# Patient Record
Sex: Female | Born: 1935 | Race: White | Hispanic: No | Smoking: Former smoker
Health system: Southern US, Community
[De-identification: ages and names within clinical notes are randomized; demographics above are authoritative.]

## PROBLEM LIST (undated history)

## (undated) DIAGNOSIS — B3731 Acute candidiasis of vulva and vagina: Secondary | ICD-10-CM

## (undated) DIAGNOSIS — M1990 Unspecified osteoarthritis, unspecified site: Secondary | ICD-10-CM

## (undated) DIAGNOSIS — I499 Cardiac arrhythmia, unspecified: Secondary | ICD-10-CM

## (undated) DIAGNOSIS — B373 Candidiasis of vulva and vagina: Secondary | ICD-10-CM

## (undated) DIAGNOSIS — K219 Gastro-esophageal reflux disease without esophagitis: Secondary | ICD-10-CM

---

## 2001-11-25 ENCOUNTER — Other Ambulatory Visit: Admission: RE | Admit: 2001-11-25 | Discharge: 2001-11-25 | Payer: Self-pay | Admitting: Obstetrics and Gynecology

## 2001-11-26 ENCOUNTER — Encounter: Admission: RE | Admit: 2001-11-26 | Discharge: 2001-11-26 | Payer: Self-pay | Admitting: Obstetrics and Gynecology

## 2001-11-26 ENCOUNTER — Encounter: Payer: Self-pay | Admitting: Obstetrics and Gynecology

## 2002-06-23 ENCOUNTER — Encounter: Payer: Self-pay | Admitting: Obstetrics and Gynecology

## 2002-06-23 ENCOUNTER — Encounter: Admission: RE | Admit: 2002-06-23 | Discharge: 2002-06-23 | Payer: Self-pay | Admitting: Obstetrics and Gynecology

## 2003-08-12 ENCOUNTER — Encounter: Admission: RE | Admit: 2003-08-12 | Discharge: 2003-08-12 | Payer: Self-pay | Admitting: Obstetrics and Gynecology

## 2004-05-15 ENCOUNTER — Encounter: Admission: RE | Admit: 2004-05-15 | Discharge: 2004-05-15 | Payer: Self-pay | Admitting: Internal Medicine

## 2004-07-20 ENCOUNTER — Ambulatory Visit (HOSPITAL_COMMUNITY): Admission: RE | Admit: 2004-07-20 | Discharge: 2004-07-20 | Payer: Self-pay | Admitting: *Deleted

## 2011-06-13 ENCOUNTER — Telehealth: Payer: Self-pay

## 2011-06-20 NOTE — Telephone Encounter (Signed)
PATIENT MADE APPT. FOR 6/11

## 2011-06-26 ENCOUNTER — Encounter (INDEPENDENT_AMBULATORY_CARE_PROVIDER_SITE_OTHER): Payer: Medicare Other

## 2011-06-26 DIAGNOSIS — R002 Palpitations: Secondary | ICD-10-CM

## 2015-10-12 NOTE — Progress Notes (Signed)
Pt is being scheduled for preop appt; please placed surgical orders in epic. Thanks.

## 2015-10-23 ENCOUNTER — Ambulatory Visit: Payer: Self-pay | Admitting: Orthopedic Surgery

## 2015-11-11 NOTE — Patient Instructions (Signed)
Verneda SkillJanice P Bourn  11/11/2015   Your procedure is scheduled on: 11/21/2015    [facility]  [place]trance [place]  elevators to 3rd floor to  Short Stay Center at   0515 AM.  Call this number if you have problems the morning of surgery [phone]   Remember: ONLY 1 PERSON MAY GO WITH YOU TO SHORT STAY TO GET  READY MORNING OF YOUR SURGERY.  Do not eat food or drink liquids :After Midnight.     Take these medicines the morning of surgery with A SIP OF WATER: none                                 You may not have any metal on your body including hair pins and              piercings  Do not wear jewelry, make-up, lotions, powders or perfumes, deodorant             Do not wear nail polish.  Do not shave  48 hours prior to surgery.     Do not bring valuables to the hospital. [facility] IS NOT             RESPONSIBLE   FOR VALUABLES.  Contacts, dentures or bridgework may not be worn into surgery.  Leave suitcase in the car. After surgery it may be brought to your room.        Special Instructions: coughing and deep breathing exercises, leg exercises               Please read over the following fact sheets you were given: _____________________________________________________________________             [facility] - Pre[facility] Before surgery, you can play an important role.  Because skin is not sterile, your skin needs to be as free of germs as possible.  You can reduce the number of germs on your skin by washing with CHG (chlorahexidine gluconate) soap before surgery.  CHG is an antiseptic cleaner which kills germs and bonds with the skin to continue killing germs even after washing. Please DO NOT use if you have an allergy to CHG or antibacterial soaps.  If your skin becomes reddened/irritated stop using the CHG and inform your nurse when you arrive at Short Stay. Do not shave (including legs and underarms) for at least 48 hours prior to  the first CHG shower.  You may shave your face/neck. Please follow these instructions carefully:  1.  Shower with CHG Soap the night before surgery and the  morning of Surgery.  2.  If you choose to wash your hair, wash your hair first as usual with your  normal  shampoo.  3.  After you shampoo, rinse your hair and body thoroughly to remove the  shampoo.                           4.  Use CHG as you would any other liquid soap.  You can apply chg directly  to the skin and wash                       Gently with a scrungie or clean washcloth.  5.  Apply the CHG  Soap to your body ONLY FROM THE NECK DOWN.   Do not use on face/ open                           Wound or open sores. Avoid contact with eyes, ears mouth and genitals (private parts).                       Wash face,  Genitals (private parts) with your normal soap.             6.  Wash thoroughly, paying special attention to the area where your surgery  will be performed.  7.  Thoroughly rinse your body with warm water from the neck down.  8.  DO NOT shower/wash with your normal soap after using and rinsing off  the CHG Soap.                9.  Pat yourself dry with a clean towel.            10.  Wear clean pajamas.            11.  Place clean sheets on your bed the night of your first shower and do not  sleep with pets. Day of Surgery : Do not apply any lotions/deodorants the morning of surgery.  Please wear clean clothes to the hospital/surgery center.  FAILURE TO FOLLOW THESE INSTRUCTIONS MAY RESULT IN THE CANCELLATION OF YOUR SURGERY PATIENT SIGNATURE_________________________________  NURSE SIGNATURE__________________________________  ________________________________________________________________________  WHAT IS A BLOOD TRANSFUSION? Blood Transfusion Information  A transfusion is the replacement of blood or some of its parts. Blood is made up of multiple cells which provide different functions.  Red blood cells carry oxygen and  are used for blood loss replacement.  White blood cells fight against infection.  Platelets control bleeding.  Plasma helps clot blood.  Other blood products are available for specialized needs, such as hemophilia or other clotting disorders. BEFORE THE TRANSFUSION  Who gives blood for transfusions?   Healthy volunteers who are fully evaluated to make sure their blood is safe. This is blood bank blood. Transfusion therapy is the safest it has ever been in the practice of medicine. Before blood is taken from a donor, a complete history is taken to make sure that person has no history of diseases nor engages in risky social behavior (examples are intravenous drug use or sexual activity with multiple partners). The donor's travel history is screened to minimize risk of transmitting infections, such as malaria. The donated blood is tested for signs of infectious diseases, such as HIV and hepatitis. The blood is then tested to be sure it is compatible with you in order to minimize the chance of a transfusion reaction. If you or a relative donates blood, this is often done in anticipation of surgery and is not appropriate for emergency situations. It takes many days to process the donated blood. RISKS AND COMPLICATIONS Although transfusion therapy is very safe and saves many lives, the main dangers of transfusion include:   Getting an infectious disease.  Developing a transfusion reaction. This is an allergic reaction to something in the blood you were given. Every precaution is taken to prevent this. The decision to have a blood transfusion has been considered carefully by your caregiver before blood is given. Blood is not given unless the benefits outweigh the risks. AFTER THE TRANSFUSION  Right after receiving a blood transfusion, you  will usually feel much better and more energetic. This is especially true if your red blood cells have gotten low (anemic). The transfusion raises the level of the  red blood cells which carry oxygen, and this usually causes an energy increase.  The nurse administering the transfusion will monitor you carefully for complications. HOME CARE INSTRUCTIONS  No special instructions are needed after a transfusion. You may find your energy is better. Speak with your caregiver about any limitations on activity for underlying diseases you may have. SEEK MEDICAL CARE IF:   Your condition is not improving after your transfusion.  You develop redness or irritation at the intravenous (IV) site. SEEK IMMEDIATE MEDICAL CARE IF:  Any of the following symptoms occur over the next 12 hours:  Shaking chills.  You have a temperature by mouth above 102 F (38.9 C), not controlled by medicine.  Chest, back, or muscle pain.  People around you feel you are not acting correctly or are confused.  Shortness of breath or difficulty breathing.  Dizziness and fainting.  You get a rash or develop hives.  You have a decrease in urine output.  Your urine turns a dark color or changes to pink, red, or brown. Any of the following symptoms occur over the next 10 days:  You have a temperature by mouth above 102 F (38.9 C), not controlled by medicine.  Shortness of breath.  Weakness after normal activity.  The white part of the eye turns yellow (jaundice).  You have a decrease in the amount of urine or are urinating less often.  Your urine turns a dark color or changes to pink, red, or brown. Document Released: 12/30/1999 Document Revised: 03/26/2011 Document Reviewed: 08/18/2007 ExitCare Patient Information 2014 [place], [place].  _______________________________________________________________________  Incentive Spirometer  An incentive spirometer is a tool that can help keep your lungs clear and active. This tool measures how well you are filling your lungs with each breath. Taking long deep breaths may help reverse or decrease the chance of developing breathing  (pulmonary) problems (especially infection) following:  A long period of time when you are unable to move or be active. BEFORE THE PROCEDURE   If the spirometer includes an indicator to show your best effort, your nurse or respiratory therapist will set it to a desired goal.  If possible, sit up straight or lean slightly forward. Try not to slouch.  Hold the incentive spirometer in an upright position. INSTRUCTIONS FOR USE  1. Sit on the edge of your bed if possible, or sit up as far as you can in bed or on a chair. 2. Hold the incentive spirometer in an upright position. 3. Breathe out normally. 4. Place the mouthpiece in your mouth and seal your lips tightly around it. 5. Breathe in slowly and as deeply as possible, raising the piston or the ball toward the top of the column. 6. Hold your breath for 3-5 seconds or for as long as possible. Allow the piston or ball to fall to the bottom of the column. 7. Remove the mouthpiece from your mouth and breathe out normally. 8. Rest for a few seconds and repeat Steps 1 through 7 at least 10 times every 1-2 hours when you are awake. Take your time and take a few normal breaths between deep breaths. 9. The spirometer may include an indicator to show your best effort. Use the indicator as a goal to work toward during each repetition. 10. After each set of 10 deep breaths, practice  coughing to be sure your lungs are clear. If you have an incision (the cut made at the time of surgery), support your incision when coughing by placing a pillow or rolled up towels firmly against it. Once you are able to get out of bed, walk around indoors and cough well. You may stop using the incentive spirometer when instructed by your caregiver.  RISKS AND COMPLICATIONS  Take your time so you do not get dizzy or light-headed.  If you are in pain, you may need to take or ask for pain medication before doing incentive spirometry. It is harder to take a deep breath if you  are having pain. AFTER USE  Rest and breathe slowly and easily.  It can be helpful to keep track of a log of your progress. Your caregiver can provide you with a simple table to help with this. If you are using the spirometer at home, follow these instructions: [place] IF:   You are having difficultly using the spirometer.  You have trouble using the spirometer as often as instructed.  Your pain medication is not giving enough relief while using the spirometer.  You develop fever of 100.5 F (38.1 C) or higher. SEEK IMMEDIATE MEDICAL CARE IF:   You cough up bloody sputum that had not been present before.  You develop fever of 102 F (38.9 C) or greater.  You develop worsening pain at or near the incision site. MAKE SURE YOU:   Understand these instructions.  Will watch your condition.  Will get help right away if you are not doing well or get worse. Document Released: 05/14/2006 Document Revised: 03/26/2011 Document Reviewed: 07/15/2006 [facility] [place] Patient Information 2014 [place], [place].   ________________________________________________________________________

## 2015-11-14 ENCOUNTER — Encounter (HOSPITAL_COMMUNITY)
Admission: RE | Admit: 2015-11-14 | Discharge: 2015-11-14 | Disposition: A | Discharge status: Home / Self Care | Payer: Medicare Other | Source: Ambulatory Visit | Attending: Orthopedic Surgery | Admitting: Orthopedic Surgery

## 2015-11-14 ENCOUNTER — Encounter (HOSPITAL_COMMUNITY): Payer: Self-pay | Admitting: *Deleted

## 2015-11-14 DIAGNOSIS — M1712 Unilateral primary osteoarthritis, left knee: Secondary | ICD-10-CM | POA: Insufficient documentation

## 2015-11-14 DIAGNOSIS — Z0183 Encounter for blood typing: Secondary | ICD-10-CM | POA: Insufficient documentation

## 2015-11-14 DIAGNOSIS — Z01812 Encounter for preprocedural laboratory examination: Secondary | ICD-10-CM | POA: Diagnosis not present

## 2015-11-14 HISTORY — DX: Unspecified osteoarthritis, unspecified site: M19.90

## 2015-11-14 HISTORY — DX: Cardiac arrhythmia, unspecified: I49.9

## 2015-11-14 HISTORY — DX: Acute candidiasis of vulva and vagina: B37.31

## 2015-11-14 HISTORY — DX: Candidiasis of vulva and vagina: B37.3

## 2015-11-14 HISTORY — DX: Gastro-esophageal reflux disease without esophagitis: K21.9

## 2015-11-14 LAB — ABO/RH: ABO/RH(D): O POS

## 2015-11-14 LAB — COMPREHENSIVE METABOLIC PANEL
ALBUMIN: 3.8 g/dL (ref 3.5–5.0)
ALT: 13 U/L — ABNORMAL LOW (ref 14–54)
ANION GAP: 7 (ref 5–15)
AST: 18 U/L (ref 15–41)
Alkaline Phosphatase: 51 U/L (ref 38–126)
BILIRUBIN TOTAL: 0.7 mg/dL (ref 0.3–1.2)
BUN: 18 mg/dL (ref 6–20)
CHLORIDE: 106 mmol/L (ref 101–111)
CO2: 26 mmol/L (ref 22–32)
Calcium: 9 mg/dL (ref 8.9–10.3)
Creatinine, Ser: 0.65 mg/dL (ref 0.44–1.00)
GFR calc Af Amer: >60 mL/min (ref >60)
GFR calc non Af Amer: >60 mL/min (ref >60)
GLUCOSE: 95 mg/dL (ref 65–99)
POTASSIUM: 4.7 mmol/L (ref 3.5–5.1)
SODIUM: 139 mmol/L (ref 135–145)
TOTAL PROTEIN: 6.7 g/dL (ref 6.5–8.1)

## 2015-11-14 LAB — URINALYSIS, ROUTINE W REFLEX MICROSCOPIC
BILIRUBIN URINE: NEGATIVE
Glucose, UA: NEGATIVE mg/dL
HGB URINE DIPSTICK: NEGATIVE
Ketones, ur: NEGATIVE mg/dL
Leukocytes, UA: NEGATIVE
Nitrite: NEGATIVE
Protein, ur: NEGATIVE mg/dL
SPECIFIC GRAVITY, URINE: 1.02 (ref 1.005–1.030)
pH: 5.5 (ref 5.0–8.0)

## 2015-11-14 LAB — CBC
HCT: 42.6 % (ref 36.0–46.0)
Hemoglobin: 14.1 g/dL (ref 12.0–15.0)
MCH: 29.7 pg (ref 26.0–34.0)
MCHC: 33.1 g/dL (ref 30.0–36.0)
MCV: 89.9 fL (ref 78.0–100.0)
PLATELETS: 221 10*3/uL (ref 150–400)
RBC: 4.74 MIL/uL (ref 3.87–5.11)
RDW: 13.7 % (ref 11.5–15.5)
WBC: 5.2 10*3/uL (ref 4.0–10.5)

## 2015-11-14 LAB — SURGICAL PCR SCREEN
MRSA, PCR: NEGATIVE
Staphylococcus aureus: POSITIVE — AB

## 2015-11-14 LAB — APTT: APTT: 31 s (ref 24–36)

## 2015-11-14 LAB — PROTIME-INR
INR: 0.91
Prothrombin Time: 12.2 seconds (ref 11.4–15.2)

## 2015-11-15 NOTE — Progress Notes (Signed)
Patient reported she has area at gum sore from partial.  I instructed patient she should notify dentist and have them evaluate prior to surgery on 11/21/2015.  Patient voiced understanding.

## 2015-11-15 NOTE — Progress Notes (Signed)
Patient returned call this am at 0915am approximately to state she had received message regarding nasal pcr swab results.  Patient was upset over news and I informed patient that it did not mean she had staph she had just come into contact with staph.  She stated she was upset that when she comes into hospital that a note will be on her door and she feels like she should have known prior to now that her swab results were positive.  I allowed patient to ventilate and support given.

## 2015-11-16 NOTE — Progress Notes (Signed)
Cardiac clearance on chart- 11/15/2015  LOV- Dr Ganji-11/07/15- on chart  Stress Test - 11/11/15- on chart

## 2015-11-18 NOTE — Progress Notes (Addendum)
No EKG per office at office of DR Jacinto HalimGanji.  recoreds were requested on 11/14/2015, 11/15/2015 and 11/16/2015.

## 2015-11-20 ENCOUNTER — Ambulatory Visit: Payer: Self-pay | Admitting: Orthopedic Surgery

## 2015-11-20 NOTE — H&P (Signed)
Lindsay Frazier DOB: [birth year] Divorced / Language: [place] / Race: White Female Date of Admission:  11/21/2015 CC: Left Knee Pain History of Present Illness The patient is a 82 year old female who comes in for a preoperative History and Physical. The patient is scheduled for a left total knee arthroplasty to be performed by Lindsay Frazier. Aluisio, MD at [facility] on 11-21-2015. The patient is a 82 year old female who presented with knee complaints. The patient reports left knee symptoms including: pain, stiffness, soreness and difficulty getting up from a seated position which began year(s) ago without any known injury.The patient feels that the symptoms are worsening. The patient has the current diagnosis of knee osteoarthritis. Previous work-up for this problem has included knee x-rays (ordered by Lindsay Frazier). This problem has not been previously treated. Note for "Knee pain": She also notes lateral hip pain and stiffness, possibly due to altered gait. This pain worsened with sitting for an extended period of time. Lindsay Frazier has been having problems with her left knee for quite a while now. It has been going on for several years, but in the past year she has noted increased discomfort and increased dysfunction. She says that she does have pain worse in her left hip and the left knee, but that the knee pain is definitely limiting her. She says it is very difficulty arising from a chair and it really takes a while for her to get walking. She has had progressive valgus deformity of the left knee. She is not having any instability, but her gait pattern has changed significantly and she does not feel quite as steady walking now. Her left hip hurts laterally. She is not having any groin pain. She is not having lower extremity weakness or paresthesia. It does not hurt to lay on the left side, but does hurt sometimes in the left hip when she lays on her right side. She has not lost any motion in her hip.  She is not having any right hip or right knee pain. She would like to get the knee fixed at this time. They have been treated conservatively in the past for the above stated problem and despite conservative measures, they continue to have progressive pain and severe functional limitations and dysfunction. They have failed non-operative management including home exercise, medications, and injections. It is felt that they would benefit from undergoing total joint replacement. Risks and benefits of the procedure have been discussed with the patient and they elect to proceed with surgery. There are no active contraindications to surgery such as ongoing infection or rapidly progressive neurological disease.  Problem List/Past Medical  Trochanteric bursitis of left hip (M70.62)  Primary osteoarthritis of left knee (M17.12)  Neuralgia, post-herpetic (B02.29)  Cardiac Arrhythmia  Intermittent Irregular Heartrate Gastroesophageal Reflux Disease  Osteoarthritis  Measles  Mumps  Menopause  Allergies  No Known Drug Allergies HYDROcodone Bitartrate *CHEMICALS*  Insomnia  Family History Cancer  Paternal Grandmother. Cerebrovascular Accident  Father. First Degree Relatives  reported Hypertension  Mother. Osteoarthritis  Mother. Osteoporosis  Mother. Severe allergy  child  Social History  Children  3 Current drinker  05/05/2015: Currently drinks wine 5-7 times per week Current work status  retired Scientist, physiological daily; does running / walking Living situation  live alone Marital status  divorced No history of drug/alcohol rehab  Not under pain contract  Number of flights of stairs before winded  2-3 Tobacco / smoke exposure  05/05/2015: no Tobacco use  Never smoker. 05/05/2015 Post-Surgical Plans  Living Will, Healthcare POA  Medication History Lindsay Frazier (0.45MG  Tablet, Oral) Active. Lindsay Frazier (5MG  Tablet, Oral) Active. Multivitamin (Oral)  Active. Calcium (Oral) Specific strength unknown - Active. Magnesium (500MG  Tablet, Oral) Active. Fish Oil (300MG  Capsule, 200 Oral) Active. Potassium (Oral) Specific strength unknown - Active.   Past Surgical History Cataract Surgery  bilateral    Review of Systems General Not Present- Chills, Fatigue, Fever, Memory Loss, Night Sweats, Weight Gain and Weight Loss. Skin Not Present- Eczema, Hives, Itching, Lesions and Rash. HEENT Not Present- Dentures, Double Vision, Headache, Hearing Loss, Tinnitus and Visual Loss. Respiratory Not Present- Allergies, Chronic Cough, Coughing up blood, Shortness of breath at rest and Shortness of breath with exertion. Cardiovascular Not Present- Chest Pain, Difficulty Breathing Lying Down, Murmur, Palpitations, Racing/skipping heartbeats and Swelling. Gastrointestinal Not Present- Abdominal Pain, Bloody Stool, Constipation, Diarrhea, Difficulty Swallowing, Heartburn, Jaundice, Loss of appetitie, Nausea and Vomiting. Female Genitourinary Not Present- Blood in Urine, Discharge, Flank Pain, Incontinence, Painful Urination, Urgency, Urinary frequency, Urinary Retention, Urinating at Night and Weak urinary stream. Musculoskeletal Present- Joint Pain. Not Present- Back Pain, Joint Swelling, Morning Stiffness, Muscle Pain, Muscle Weakness and Spasms. Neurological Not Present- Blackout spells, Difficulty with balance, Dizziness, Paralysis, Tremor and Weakness. Psychiatric Not Present- Insomnia.  Vitals Weight: 134 lb Height: 65in Weight was reported by patient. Height was reported by patient. Body Surface Area: 1.67 m Body Mass Index: 22.3 kg/m  Pulse: 64 (Regular)  BP: 138/82 (Sitting, Right Arm, Standard)  Physical Exam  General Mental Status -Alert, cooperative and good historian. General Appearance-pleasant, Not in acute distress. Orientation-Oriented X3. Build & Nutrition-Well nourished and Well developed.  Head and  Neck Head-normocephalic, atraumatic . Neck Global Assessment - supple, no bruit auscultated on the right, no bruit auscultated on the left.  Eye Pupil - Bilateral-Regular and Round. Motion - Bilateral-EOMI.  ENMT Note: Upper and lower dentures   Chest and Lung Exam Auscultation Breath sounds - clear at anterior chest wall and clear at posterior chest wall. Adventitious sounds - No Adventitious sounds.  Cardiovascular Auscultation Rhythm - Regular rate and rhythm. Heart Sounds - S1 WNL and S2 WNL. Murmurs & Other Heart Sounds - Auscultation of the heart reveals - No Murmurs.  Abdomen Palpation/Percussion Tenderness - Abdomen is non-tender to palpation. Rigidity (guarding) - Abdomen is soft. Auscultation Auscultation of the abdomen reveals - Bowel sounds normal.  Female Genitourinary Note: Not done, not pertinent to present illness   Musculoskeletal Note: Very pleasant, well-developed female, alert and oriented, in no apparent distress. Evaluation of her right hip, normal motion, no discomfort. Right knee, no deformity, no effusion. Range 0 to 135. No tenderness or instability. Left hip, flexion 120, rotation in 30, out 40, abduction 40 without pain in range of motion of the hip. She is tender over the left greater trochanter and palpation here reproduces her pain. Her left knee shows a significant valgus deformity. She has ranged 5 to 130. There is moderate crepitus on range of motion. There is tenderness lateral greater than medial with no instability noted. Pulses, sensation and motor are intact in both lower extremities. Gait pattern is significantly antalgic with that valgus deformity and she really has a abnormal thrust to the left leg while ambulating.  RADIOGRAPHS AP pelvis, lateral left hip, no hip arthritis, acute or chronic hip abnormalities. AP both knees, lateral of the left showed she has got significant lateral compartment arthritis of the left worse than  right knee. She is a  little narrow on the right than the left. She is bone-on-bone lateral with significant patellofemoral spurring also with valgus deformity.   Assessment & Plan Primary osteoarthritis of left knee (M17.12)  Note:Surgical Plans: Left Total Knee Replacement  Disposition: Home  PCP: Home with Family  IV TXA  Anesthesia Issues: None  Signed electronically by Beckey RutterAlezandrew L Ahuva Poynor, III PA-C

## 2015-11-21 ENCOUNTER — Encounter (HOSPITAL_COMMUNITY)
Admission: RE | Disposition: A | Discharge status: Home Health | Payer: Self-pay | Source: Ambulatory Visit | Attending: Orthopedic Surgery

## 2015-11-21 ENCOUNTER — Inpatient Hospital Stay (HOSPITAL_COMMUNITY): Payer: Medicare Other | Admitting: Registered Nurse

## 2015-11-21 ENCOUNTER — Encounter (HOSPITAL_COMMUNITY): Payer: Self-pay | Admitting: *Deleted

## 2015-11-21 ENCOUNTER — Inpatient Hospital Stay (HOSPITAL_COMMUNITY)
Admission: RE | Admit: 2015-11-21 | Discharge: 2015-11-23 | DRG: 470 | Disposition: A | Discharge status: Home Health | Payer: Medicare Other | Source: Ambulatory Visit | Attending: Orthopedic Surgery | Admitting: Orthopedic Surgery

## 2015-11-21 DIAGNOSIS — Z9842 Cataract extraction status, left eye: Secondary | ICD-10-CM | POA: Diagnosis not present

## 2015-11-21 DIAGNOSIS — M25552 Pain in left hip: Secondary | ICD-10-CM | POA: Diagnosis present

## 2015-11-21 DIAGNOSIS — M1710 Unilateral primary osteoarthritis, unspecified knee: Secondary | ICD-10-CM | POA: Diagnosis present

## 2015-11-21 DIAGNOSIS — M179 Osteoarthritis of knee, unspecified: Secondary | ICD-10-CM | POA: Diagnosis present

## 2015-11-21 DIAGNOSIS — Z87891 Personal history of nicotine dependence: Secondary | ICD-10-CM

## 2015-11-21 DIAGNOSIS — M21062 Valgus deformity, not elsewhere classified, left knee: Secondary | ICD-10-CM | POA: Diagnosis present

## 2015-11-21 DIAGNOSIS — Z9841 Cataract extraction status, right eye: Secondary | ICD-10-CM

## 2015-11-21 DIAGNOSIS — M1712 Unilateral primary osteoarthritis, left knee: Principal | ICD-10-CM | POA: Diagnosis present

## 2015-11-21 HISTORY — PX: TOTAL KNEE ARTHROPLASTY: SHX125

## 2015-11-21 LAB — TYPE AND SCREEN
ABO/RH(D): O POS
ANTIBODY SCREEN: NEGATIVE

## 2015-11-21 SURGERY — ARTHROPLASTY, KNEE, TOTAL
Anesthesia: Spinal | Site: Knee | Laterality: Left

## 2015-11-21 MED ORDER — MIDAZOLAM HCL 2 MG/2ML IJ SOLN
INTRAMUSCULAR | Status: AC
  Filled 2015-11-21: qty 2

## 2015-11-21 MED ORDER — MEPERIDINE HCL 50 MG/ML IJ SOLN: 6.2500 mg | INTRAMUSCULAR | Status: DC | PRN

## 2015-11-21 MED ORDER — MORPHINE SULFATE (PF) 10 MG/ML IV SOLN: 1.0000 mg | INTRAVENOUS | Status: DC | PRN

## 2015-11-21 MED ORDER — METOCLOPRAMIDE HCL 5 MG/ML IJ SOLN: 5.0000 mg | Freq: Three times a day (TID) | INTRAMUSCULAR | Status: DC | PRN

## 2015-11-21 MED ORDER — PROMETHAZINE HCL 25 MG/ML IJ SOLN: 12.5000 mg | Freq: Once | INTRAMUSCULAR | Status: DC | PRN

## 2015-11-21 MED ORDER — DEXAMETHASONE SODIUM PHOSPHATE 10 MG/ML IJ SOLN
10.0000 mg | Freq: Once | INTRAMUSCULAR | Status: AC
  Administered 2015-11-21: 10 mg via INTRAVENOUS

## 2015-11-21 MED ORDER — LIDOCAINE 2% (20 MG/ML) 5 ML SYRINGE
INTRAMUSCULAR | Status: DC | PRN
  Administered 2015-11-21: 40 mg via INTRAVENOUS

## 2015-11-21 MED ORDER — CEFAZOLIN SODIUM-DEXTROSE 2-4 GM/100ML-% IV SOLN
INTRAVENOUS | Status: AC
Start: 2015-11-21 — End: 2015-11-21
  Filled 2015-11-21: qty 100

## 2015-11-21 MED ORDER — KETOROLAC TROMETHAMINE 30 MG/ML IJ SOLN: 30.0000 mg | Freq: Once | INTRAMUSCULAR | Status: DC

## 2015-11-21 MED ORDER — POLYETHYLENE GLYCOL 3350 17 G PO PACK: 17.0000 g | PACK | Freq: Every day | ORAL | Status: DC | PRN

## 2015-11-21 MED ORDER — FLEET ENEMA 7-19 GM/118ML RE ENEM: 1.0000 | ENEMA | Freq: Once | RECTAL | Status: DC | PRN

## 2015-11-21 MED ORDER — TRAMADOL HCL 50 MG PO TABS: 50.0000 mg | ORAL_TABLET | Freq: Four times a day (QID) | ORAL | Status: DC | PRN

## 2015-11-21 MED ORDER — CEFAZOLIN SODIUM-DEXTROSE 2-4 GM/100ML-% IV SOLN
2.0000 g | INTRAVENOUS | Status: AC
  Administered 2015-11-21: 2 g via INTRAVENOUS

## 2015-11-21 MED ORDER — BUPIVACAINE IN DEXTROSE 0.75-8.25 % IT SOLN
INTRATHECAL | Status: DC | PRN
  Administered 2015-11-21: 1.6 mL via INTRATHECAL

## 2015-11-21 MED ORDER — SODIUM CHLORIDE 0.9 % IR SOLN
Status: DC | PRN
  Administered 2015-11-21: 1000 mL

## 2015-11-21 MED ORDER — BUPIVACAINE LIPOSOME 1.3 % IJ SUSP
INTRAMUSCULAR | Status: DC | PRN
  Administered 2015-11-21: 20 mL

## 2015-11-21 MED ORDER — DEXAMETHASONE SODIUM PHOSPHATE 10 MG/ML IJ SOLN
INTRAMUSCULAR | Status: AC
  Filled 2015-11-21: qty 1

## 2015-11-21 MED ORDER — BISACODYL 10 MG RE SUPP: 10.0000 mg | Freq: Every day | RECTAL | Status: DC | PRN

## 2015-11-21 MED ORDER — PROPOFOL 500 MG/50ML IV EMUL
INTRAVENOUS | Status: DC | PRN
  Administered 2015-11-21: 75 ug/kg/min via INTRAVENOUS

## 2015-11-21 MED ORDER — EPHEDRINE SULFATE-NACL 50-0.9 MG/10ML-% IV SOSY
PREFILLED_SYRINGE | INTRAVENOUS | Status: DC | PRN
  Administered 2015-11-21: 10 mg via INTRAVENOUS
  Administered 2015-11-21: 15 mg via INTRAVENOUS

## 2015-11-21 MED ORDER — EPHEDRINE 5 MG/ML INJ
INTRAVENOUS | Status: AC
  Filled 2015-11-21: qty 10

## 2015-11-21 MED ORDER — FENTANYL CITRATE (PF) 100 MCG/2ML IJ SOLN
INTRAMUSCULAR | Status: DC | PRN
Start: 2015-11-21 — End: 2015-11-21
  Administered 2015-11-21 (×2): 50 ug via INTRAVENOUS

## 2015-11-21 MED ORDER — METHOCARBAMOL 500 MG PO TABS
500.0000 mg | ORAL_TABLET | Freq: Four times a day (QID) | ORAL | Status: DC | PRN
  Administered 2015-11-22 – 2015-11-23 (×4): 500 mg via ORAL
  Filled 2015-11-21 (×5): qty 1

## 2015-11-21 MED ORDER — POTASSIUM CHLORIDE CRYS ER 10 MEQ PO TBCR
10.0000 meq | EXTENDED_RELEASE_TABLET | Freq: Every day | ORAL | Status: DC
  Administered 2015-11-22 – 2015-11-23 (×2): 10 meq via ORAL
  Filled 2015-11-21 (×2): qty 1

## 2015-11-21 MED ORDER — SODIUM CHLORIDE 0.9 % IJ SOLN
INTRAMUSCULAR | Status: AC
  Filled 2015-11-21: qty 50

## 2015-11-21 MED ORDER — PROPOFOL 10 MG/ML IV BOLUS
INTRAVENOUS | Status: AC
  Filled 2015-11-21: qty 60

## 2015-11-21 MED ORDER — MENTHOL 3 MG MT LOZG: 1.0000 | LOZENGE | OROMUCOSAL | Status: DC | PRN

## 2015-11-21 MED ORDER — ACETAMINOPHEN 10 MG/ML IV SOLN
INTRAVENOUS | Status: AC
  Filled 2015-11-21: qty 100

## 2015-11-21 MED ORDER — TRANEXAMIC ACID 1000 MG/10ML IV SOLN
1000.0000 mg | INTRAVENOUS | Status: AC
  Administered 2015-11-21: 1000 mg via INTRAVENOUS
  Filled 2015-11-21: qty 1100

## 2015-11-21 MED ORDER — BUPIVACAINE LIPOSOME 1.3 % IJ SUSP
20.0000 mL | Freq: Once | INTRAMUSCULAR | Status: DC
  Filled 2015-11-21: qty 20

## 2015-11-21 MED ORDER — SODIUM CHLORIDE 0.9 % IJ SOLN
INTRAMUSCULAR | Status: DC | PRN
  Administered 2015-11-21: 30 mL

## 2015-11-21 MED ORDER — HYDROMORPHONE HCL 1 MG/ML IJ SOLN
0.5000 mg | INTRAMUSCULAR | Status: DC | PRN
  Administered 2015-11-21 – 2015-11-22 (×3): 0.5 mg via INTRAVENOUS
  Filled 2015-11-21 (×3): qty 1

## 2015-11-21 MED ORDER — BUPIVACAINE HCL 0.25 % IJ SOLN
INTRAMUSCULAR | Status: DC | PRN
  Administered 2015-11-21: 30 mL

## 2015-11-21 MED ORDER — MAGNESIUM 250 MG PO TABS: 250.0000 mg | ORAL_TABLET | Freq: Every day | ORAL | Status: DC

## 2015-11-21 MED ORDER — ACETAMINOPHEN 500 MG PO TABS
1000.0000 mg | ORAL_TABLET | Freq: Four times a day (QID) | ORAL | Status: AC
  Administered 2015-11-21 – 2015-11-22 (×3): 1000 mg via ORAL
  Filled 2015-11-21 (×3): qty 2

## 2015-11-21 MED ORDER — MORPHINE SULFATE (PF) 2 MG/ML IV SOLN
1.0000 mg | INTRAVENOUS | Status: DC | PRN
  Administered 2015-11-21: 1 mg via INTRAVENOUS
  Filled 2015-11-21: qty 1

## 2015-11-21 MED ORDER — PHENOL 1.4 % MT LIQD
1.0000 | OROMUCOSAL | Status: DC | PRN
Start: 2015-11-21 — End: 2015-11-23
  Filled 2015-11-21: qty 177

## 2015-11-21 MED ORDER — METHOCARBAMOL 1000 MG/10ML IJ SOLN
500.0000 mg | Freq: Four times a day (QID) | INTRAMUSCULAR | Status: DC | PRN
  Administered 2015-11-21: 500 mg via INTRAVENOUS
  Filled 2015-11-21: qty 550
  Filled 2015-11-21: qty 5

## 2015-11-21 MED ORDER — ONDANSETRON HCL 4 MG/2ML IJ SOLN
INTRAMUSCULAR | Status: AC
  Filled 2015-11-21: qty 2

## 2015-11-21 MED ORDER — 0.9 % SODIUM CHLORIDE (POUR BTL) OPTIME
TOPICAL | Status: DC | PRN
  Administered 2015-11-21: 1000 mL

## 2015-11-21 MED ORDER — PROPOFOL 10 MG/ML IV BOLUS
INTRAVENOUS | Status: DC | PRN
Start: 2015-11-21 — End: 2015-11-21
  Administered 2015-11-21 (×2): 20 mg via INTRAVENOUS

## 2015-11-21 MED ORDER — LACTATED RINGERS IV SOLN
INTRAVENOUS | Status: DC
  Administered 2015-11-21 (×3): via INTRAVENOUS

## 2015-11-21 MED ORDER — FENTANYL CITRATE (PF) 100 MCG/2ML IJ SOLN
INTRAMUSCULAR | Status: AC
  Filled 2015-11-21: qty 2

## 2015-11-21 MED ORDER — DIPHENHYDRAMINE HCL 12.5 MG/5ML PO ELIX: 12.5000 mg | ORAL_SOLUTION | ORAL | Status: DC | PRN

## 2015-11-21 MED ORDER — FENTANYL CITRATE (PF) 100 MCG/2ML IJ SOLN: 25.0000 ug | INTRAMUSCULAR | Status: DC | PRN

## 2015-11-21 MED ORDER — ONDANSETRON HCL 4 MG/2ML IJ SOLN
4.0000 mg | Freq: Four times a day (QID) | INTRAMUSCULAR | Status: DC | PRN
  Administered 2015-11-22: 4 mg via INTRAVENOUS
  Filled 2015-11-21: qty 2

## 2015-11-21 MED ORDER — STERILE WATER FOR IRRIGATION IR SOLN
Status: DC | PRN
  Administered 2015-11-21: 3000 mL

## 2015-11-21 MED ORDER — TRANEXAMIC ACID 1000 MG/10ML IV SOLN
1000.0000 mg | Freq: Once | INTRAVENOUS | Status: AC
  Administered 2015-11-21: 1000 mg via INTRAVENOUS
  Filled 2015-11-21: qty 1100

## 2015-11-21 MED ORDER — CEFAZOLIN SODIUM-DEXTROSE 2-4 GM/100ML-% IV SOLN
2.0000 g | Freq: Four times a day (QID) | INTRAVENOUS | Status: AC
  Administered 2015-11-21 (×2): 2 g via INTRAVENOUS
  Filled 2015-11-21 (×2): qty 100

## 2015-11-21 MED ORDER — ONDANSETRON HCL 4 MG PO TABS: 4.0000 mg | ORAL_TABLET | Freq: Four times a day (QID) | ORAL | Status: DC | PRN

## 2015-11-21 MED ORDER — MAGNESIUM OXIDE 400 (241.3 MG) MG PO TABS
400.0000 mg | ORAL_TABLET | Freq: Every day | ORAL | Status: DC
  Administered 2015-11-22 – 2015-11-23 (×2): 400 mg via ORAL
  Filled 2015-11-21 (×2): qty 1

## 2015-11-21 MED ORDER — POTASSIUM 99 MG PO TABS: 99.0000 mg | ORAL_TABLET | Freq: Every day | ORAL | Status: DC

## 2015-11-21 MED ORDER — METOCLOPRAMIDE HCL 5 MG PO TABS: 5.0000 mg | ORAL_TABLET | Freq: Three times a day (TID) | ORAL | Status: DC | PRN

## 2015-11-21 MED ORDER — ONDANSETRON HCL 4 MG/2ML IJ SOLN
INTRAMUSCULAR | Status: DC | PRN
  Administered 2015-11-21: 4 mg via INTRAVENOUS

## 2015-11-21 MED ORDER — SODIUM CHLORIDE 0.9 % IV SOLN
INTRAVENOUS | Status: DC
  Administered 2015-11-21: 75 mL/h via INTRAVENOUS
  Administered 2015-11-22: 01:00:00 via INTRAVENOUS

## 2015-11-21 MED ORDER — LIDOCAINE 2% (20 MG/ML) 5 ML SYRINGE
INTRAMUSCULAR | Status: AC
Start: 2015-11-21 — End: 2015-11-21
  Filled 2015-11-21: qty 5

## 2015-11-21 MED ORDER — ACETAMINOPHEN 325 MG PO TABS
650.0000 mg | ORAL_TABLET | Freq: Four times a day (QID) | ORAL | Status: DC | PRN
  Administered 2015-11-22: 650 mg via ORAL
  Filled 2015-11-21: qty 2

## 2015-11-21 MED ORDER — RIVAROXABAN 10 MG PO TABS
10.0000 mg | ORAL_TABLET | Freq: Every day | ORAL | Status: DC
  Administered 2015-11-22 – 2015-11-23 (×2): 10 mg via ORAL
  Filled 2015-11-21 (×2): qty 1

## 2015-11-21 MED ORDER — DEXAMETHASONE SODIUM PHOSPHATE 10 MG/ML IJ SOLN
10.0000 mg | Freq: Once | INTRAMUSCULAR | Status: AC
  Administered 2015-11-22: 10 mg via INTRAVENOUS
  Filled 2015-11-21: qty 1

## 2015-11-21 MED ORDER — OXYCODONE HCL 5 MG PO TABS
5.0000 mg | ORAL_TABLET | ORAL | Status: DC | PRN
  Administered 2015-11-21 (×3): 10 mg via ORAL
  Administered 2015-11-21 (×2): 5 mg via ORAL
  Administered 2015-11-21 – 2015-11-23 (×9): 10 mg via ORAL
  Filled 2015-11-21 (×5): qty 2
  Filled 2015-11-21: qty 1
  Filled 2015-11-21 (×5): qty 2
  Filled 2015-11-21: qty 1
  Filled 2015-11-21 (×2): qty 2

## 2015-11-21 MED ORDER — ACETAMINOPHEN 650 MG RE SUPP: 650.0000 mg | Freq: Four times a day (QID) | RECTAL | Status: DC | PRN

## 2015-11-21 MED ORDER — CHLORHEXIDINE GLUCONATE 4 % EX LIQD: 60.0000 mL | Freq: Once | CUTANEOUS | Status: DC

## 2015-11-21 MED ORDER — BUPIVACAINE HCL (PF) 0.25 % IJ SOLN
INTRAMUSCULAR | Status: AC
  Filled 2015-11-21: qty 30

## 2015-11-21 MED ORDER — DOCUSATE SODIUM 100 MG PO CAPS
100.0000 mg | ORAL_CAPSULE | Freq: Two times a day (BID) | ORAL | Status: DC
Start: 2015-11-21 — End: 2015-11-23
  Administered 2015-11-21 – 2015-11-23 (×5): 100 mg via ORAL
  Filled 2015-11-21 (×5): qty 1

## 2015-11-21 MED ORDER — ACETAMINOPHEN 10 MG/ML IV SOLN
1000.0000 mg | Freq: Once | INTRAVENOUS | Status: AC
  Administered 2015-11-21: 1000 mg via INTRAVENOUS

## 2015-11-21 SURGICAL SUPPLY — 49 items
BAG DECANTER FOR FLEXI CONT (MISCELLANEOUS) IMPLANT
BAG ZIPLOCK 12X15 (MISCELLANEOUS) ×3 IMPLANT
BANDAGE ACE 6X5 VEL STRL LF (GAUZE/BANDAGES/DRESSINGS) ×3 IMPLANT
BLADE SAG 18X100X1.27 (BLADE) ×3 IMPLANT
BLADE SAW SGTL 11.0X1.19X90.0M (BLADE) ×3 IMPLANT
BOWL SMART MIX CTS (DISPOSABLE) ×3 IMPLANT
CAP KNEE TOTAL 3 SIGMA ×3 IMPLANT
CEMENT HV SMART SET (Cement) ×6 IMPLANT
CLOSURE WOUND 1/2 X4 (GAUZE/BANDAGES/DRESSINGS) ×1
CLOTH BEACON ORANGE TIMEOUT ST (SAFETY) ×3 IMPLANT
CUFF TOURN SGL QUICK 34 (TOURNIQUET CUFF) ×3
CUFF TRNQT CYL 34X4X40X1 (TOURNIQUET CUFF) ×1 IMPLANT
DECANTER SPIKE VIAL GLASS SM (MISCELLANEOUS) ×3 IMPLANT
DRAPE U-SHAPE 47X51 STRL (DRAPES) ×3 IMPLANT
DRSG ADAPTIC 3X8 NADH LF (GAUZE/BANDAGES/DRESSINGS) ×3 IMPLANT
DRSG PAD ABDOMINAL 8X10 ST (GAUZE/BANDAGES/DRESSINGS) ×3 IMPLANT
DURAPREP 26ML APPLICATOR (WOUND CARE) ×3 IMPLANT
ELECT REM PT RETURN 9FT ADLT (ELECTROSURGICAL) ×3
ELECTRODE REM PT RTRN 9FT ADLT (ELECTROSURGICAL) ×1 IMPLANT
EVACUATOR 1/8 PVC DRAIN (DRAIN) ×3 IMPLANT
GAUZE SPONGE 4X4 12PLY STRL (GAUZE/BANDAGES/DRESSINGS) ×3 IMPLANT
GLOVE BIO SURGEON STRL SZ7.5 (GLOVE) IMPLANT
GLOVE BIO SURGEON STRL SZ8 (GLOVE) ×3 IMPLANT
GLOVE BIOGEL PI IND STRL 6.5 (GLOVE) IMPLANT
GLOVE BIOGEL PI IND STRL 8 (GLOVE) ×1 IMPLANT
GLOVE BIOGEL PI INDICATOR 6.5 (GLOVE)
GLOVE BIOGEL PI INDICATOR 8 (GLOVE) ×2
GLOVE SURG SS PI 6.5 STRL IVOR (GLOVE) ×18 IMPLANT
GOWN STRL REUS W/TWL LRG LVL3 (GOWN DISPOSABLE) ×6 IMPLANT
GOWN STRL REUS W/TWL XL LVL3 (GOWN DISPOSABLE) ×6 IMPLANT
HANDPIECE INTERPULSE COAX TIP (DISPOSABLE) ×2
IMMOBILIZER KNEE 20 (SOFTGOODS) ×3
IMMOBILIZER KNEE 20 THIGH 36 (SOFTGOODS) ×1 IMPLANT
MANIFOLD NEPTUNE II (INSTRUMENTS) ×3 IMPLANT
NS IRRIG 1000ML POUR BTL (IV SOLUTION) ×3 IMPLANT
PACK TOTAL KNEE CUSTOM (KITS) ×3 IMPLANT
PADDING CAST COTTON 6X4 STRL (CAST SUPPLIES) ×3 IMPLANT
POSITIONER SURGICAL ARM (MISCELLANEOUS) ×3 IMPLANT
SET HNDPC FAN SPRY TIP SCT (DISPOSABLE) ×1 IMPLANT
STRIP CLOSURE SKIN 1/2X4 (GAUZE/BANDAGES/DRESSINGS) ×2 IMPLANT
SUT MNCRL AB 4-0 PS2 18 (SUTURE) ×3 IMPLANT
SUT VIC AB 2-0 CT1 27 (SUTURE) ×6
SUT VIC AB 2-0 CT1 TAPERPNT 27 (SUTURE) ×3 IMPLANT
SUT VLOC 180 0 24IN GS25 (SUTURE) ×3 IMPLANT
SYR 50ML LL SCALE MARK (SYRINGE) IMPLANT
TRAY FOLEY W/METER SILVER 16FR (SET/KITS/TRAYS/PACK) ×3 IMPLANT
WATER STERILE IRR 1500ML POUR (IV SOLUTION) ×3 IMPLANT
WRAP KNEE MAXI GEL POST OP (GAUZE/BANDAGES/DRESSINGS) ×3 IMPLANT
YANKAUER SUCT BULB TIP 10FT TU (MISCELLANEOUS) ×3 IMPLANT

## 2015-11-21 NOTE — Anesthesia Procedure Notes (Signed)
Spinal  Patient location during procedure: OR Start time: 11/21/2015 7:06 AM End time: 11/21/2015 7:10 AM Staffing Anesthesiologist: Leilani AbleHATCHETT, Taeya Theall Performed: anesthesiologist  Preanesthetic Checklist Completed: patient identified, surgical consent, pre-op evaluation, timeout performed, IV checked, risks and benefits discussed and monitors and equipment checked Spinal Block Patient position: sitting Prep: site prepped and draped and DuraPrep Patient monitoring: heart rate, cardiac monitor, continuous pulse ox and blood pressure Approach: midline Location: L3-4 Injection technique: single-shot Needle Needle type: Pencan  Needle gauge: 24 G Needle length: 9 cm Needle insertion depth: 6 cm Assessment Sensory level: T10

## 2015-11-21 NOTE — Anesthesia Postprocedure Evaluation (Signed)
Anesthesia Post Note  Patient: Lindsay Frazier  Procedure(s) Performed: Procedure(s) (LRB): LEFT TOTAL KNEE ARTHROPLASTY (Left)  Patient location during evaluation: PACU Anesthesia Type: Spinal Level of consciousness: awake Pain management: pain level controlled Vital Signs Assessment: post-procedure vital signs reviewed and stable Respiratory status: spontaneous breathing Cardiovascular status: stable Postop Assessment: no headache, no backache, spinal receding, patient able to bend at knees and no signs of nausea or vomiting Anesthetic complications: no     Last Vitals:  Vitals:   11/21/15 0900 11/21/15 0915  BP: 121/65   Pulse: (!) 59 (!) 59  Resp: 12 14  Temp:  36.4 C    Last Pain:  Vitals:   11/21/15 0544  TempSrc: Oral   Pain Goal: Patients Stated Pain Goal: 4 (11/21/15 0559)  LLE Motor Response: Purposeful movement (11/21/15 0915) LLE Sensation: Decreased;Numbness (11/21/15 0915) RLE Motor Response: Purposeful movement (11/21/15 0915) RLE Sensation: Full sensation (11/21/15 0915) L Sensory Level: L4-Anterior knee, lower leg (11/21/15 0915) R Sensory Level: S1-Sole of foot, small toes (11/21/15 0915)  Sakara Lehtinen JR,JOHN Susann GivensFRANKLIN

## 2015-11-21 NOTE — Interval H&P Note (Signed)
History and Physical Interval Note:  11/21/2015 6:45 AM  Lindsay Frazier  has presented today for surgery, with the diagnosis of OA left knee  The various methods of treatment have been discussed with the patient and family. After consideration of risks, benefits and other options for treatment, the patient has consented to  Procedure(s): LEFT TOTAL KNEE ARTHROPLASTY (Left) as a surgical intervention .  The patient's history has been reviewed, patient examined, no change in status, stable for surgery.  I have reviewed the patient's chart and labs.  Questions were answered to the patient's satisfaction.     Loanne DrillingALUISIO,Shaleen Talamantez V

## 2015-11-21 NOTE — H&P (View-Only) (Signed)
Lindsay Frazier DOB: [birth year] Divorced / Language: [place] / Race: White Female Date of Admission:  11/21/2015 CC: Left Knee Pain History of Present Illness The patient is a 82 year old female who comes in for a preoperative History and Physical. The patient is scheduled for a left total knee arthroplasty to be performed by Dr. Gus Rankin. Aluisio, MD at [facility] on 11-21-2015. The patient is a 82 year old female who presented with knee complaints. The patient reports left knee symptoms including: pain, stiffness, soreness and difficulty getting up from a seated position which began year(s) ago without any known injury.The patient feels that the symptoms are worsening. The patient has the current diagnosis of knee osteoarthritis. Previous work-up for this problem has included knee x-rays (ordered by Dr. Amanda Pea). This problem has not been previously treated. Note for "Knee pain": She also notes lateral hip pain and stiffness, possibly due to altered gait. This pain worsened with sitting for an extended period of time. [name] has been having problems with her left knee for quite a while now. It has been going on for several years, but in the past year she has noted increased discomfort and increased dysfunction. She says that she does have pain worse in her left hip and the left knee, but that the knee pain is definitely limiting her. She says it is very difficulty arising from a chair and it really takes a while for her to get walking. She has had progressive valgus deformity of the left knee. She is not having any instability, but her gait pattern has changed significantly and she does not feel quite as steady walking now. Her left hip hurts laterally. She is not having any groin pain. She is not having lower extremity weakness or paresthesia. It does not hurt to lay on the left side, but does hurt sometimes in the left hip when she lays on her right side. She has not lost any motion in her hip.  She is not having any right hip or right knee pain. She would like to get the knee fixed at this time. They have been treated conservatively in the past for the above stated problem and despite conservative measures, they continue to have progressive pain and severe functional limitations and dysfunction. They have failed non-operative management including home exercise, medications, and injections. It is felt that they would benefit from undergoing total joint replacement. Risks and benefits of the procedure have been discussed with the patient and they elect to proceed with surgery. There are no active contraindications to surgery such as ongoing infection or rapidly progressive neurological disease.  Problem List/Past Medical  Trochanteric bursitis of left hip (M70.62)  Primary osteoarthritis of left knee (M17.12)  Neuralgia, post-herpetic (B02.29)  Cardiac Arrhythmia  Intermittent Irregular Heartrate Gastroesophageal Reflux Disease  Osteoarthritis  Measles  Mumps  Menopause  Allergies  No Known Drug Allergies HYDROcodone Bitartrate *CHEMICALS*  Insomnia  Family History Cancer  Paternal Grandmother. Cerebrovascular Accident  Father. First Degree Relatives  reported Hypertension  Mother. Osteoarthritis  Mother. Osteoporosis  Mother. Severe allergy  child  Social History  Children  3 Current drinker  05/05/2015: Currently drinks wine 5-7 times per week Current work status  retired Scientist, physiological daily; does running / walking Living situation  live alone Marital status  divorced No history of drug/alcohol rehab  Not under pain contract  Number of flights of stairs before winded  2-3 Tobacco / smoke exposure  05/05/2015: no Tobacco use  Never smoker. 05/05/2015 Post-Surgical Plans  Living Will, Healthcare POA  Medication History Premarin (0.45MG  Tablet, Oral) Active. Norethindrone Acetate (5MG  Tablet, Oral) Active. Multivitamin (Oral)  Active. Calcium (Oral) Specific strength unknown - Active. Magnesium (500MG  Tablet, Oral) Active. Fish Oil (300MG  Capsule, 200 Oral) Active. Potassium (Oral) Specific strength unknown - Active.   Past Surgical History Cataract Surgery  bilateral    Review of Systems General Not Present- Chills, Fatigue, Fever, Memory Loss, Night Sweats, Weight Gain and Weight Loss. Skin Not Present- Eczema, Hives, Itching, Lesions and Rash. HEENT Not Present- Dentures, Double Vision, Headache, Hearing Loss, Tinnitus and Visual Loss. Respiratory Not Present- Allergies, Chronic Cough, Coughing up blood, Shortness of breath at rest and Shortness of breath with exertion. Cardiovascular Not Present- Chest Pain, Difficulty Breathing Lying Down, Murmur, Palpitations, Racing/skipping heartbeats and Swelling. Gastrointestinal Not Present- Abdominal Pain, Bloody Stool, Constipation, Diarrhea, Difficulty Swallowing, Heartburn, Jaundice, Loss of appetitie, Nausea and Vomiting. Female Genitourinary Not Present- Blood in Urine, Discharge, Flank Pain, Incontinence, Painful Urination, Urgency, Urinary frequency, Urinary Retention, Urinating at Night and Weak urinary stream. Musculoskeletal Present- Joint Pain. Not Present- Back Pain, Joint Swelling, Morning Stiffness, Muscle Pain, Muscle Weakness and Spasms. Neurological Not Present- Blackout spells, Difficulty with balance, Dizziness, Paralysis, Tremor and Weakness. Psychiatric Not Present- Insomnia.  Vitals Weight: 134 lb Height: 65in Weight was reported by patient. Height was reported by patient. Body Surface Area: 1.67 m Body Mass Index: 22.3 kg/m  Pulse: 64 (Regular)  BP: 138/82 (Sitting, Right Arm, Standard)  Physical Exam  General Mental Status -Alert, cooperative and good historian. General Appearance-pleasant, Not in acute distress. Orientation-Oriented X3. Build & Nutrition-Well nourished and Well developed.  Head and  Neck Head-normocephalic, atraumatic . Neck Global Assessment - supple, no bruit auscultated on the right, no bruit auscultated on the left.  Eye Pupil - Bilateral-Regular and Round. Motion - Bilateral-EOMI.  ENMT Note: Upper and lower dentures   Chest and Lung Exam Auscultation Breath sounds - clear at anterior chest wall and clear at posterior chest wall. Adventitious sounds - No Adventitious sounds.  Cardiovascular Auscultation Rhythm - Regular rate and rhythm. Heart Sounds - S1 WNL and S2 WNL. Murmurs & Other Heart Sounds - Auscultation of the heart reveals - No Murmurs.  Abdomen Palpation/Percussion Tenderness - Abdomen is non-tender to palpation. Rigidity (guarding) - Abdomen is soft. Auscultation Auscultation of the abdomen reveals - Bowel sounds normal.  Female Genitourinary Note: Not done, not pertinent to present illness   Musculoskeletal Note: Very pleasant, well-developed female, alert and oriented, in no apparent distress. Evaluation of her right hip, normal motion, no discomfort. Right knee, no deformity, no effusion. Range 0 to 135. No tenderness or instability. Left hip, flexion 120, rotation in 30, out 40, abduction 40 without pain in range of motion of the hip. She is tender over the left greater trochanter and palpation here reproduces her pain. Her left knee shows a significant valgus deformity. She has ranged 5 to 130. There is moderate crepitus on range of motion. There is tenderness lateral greater than medial with no instability noted. Pulses, sensation and motor are intact in both lower extremities. Gait pattern is significantly antalgic with that valgus deformity and she really has a abnormal thrust to the left leg while ambulating.  RADIOGRAPHS AP pelvis, lateral left hip, no hip arthritis, acute or chronic hip abnormalities. AP both knees, lateral of the left showed she has got significant lateral compartment arthritis of the left worse than  right knee. She is a  little narrow on the right than the left. She is bone-on-bone lateral with significant patellofemoral spurring also with valgus deformity.   Assessment & Plan Primary osteoarthritis of left knee (M17.12)  Note:Surgical Plans: Left Total Knee Replacement  Disposition: Home  PCP: Home with Family  IV TXA  Anesthesia Issues: None  Signed electronically by Beckey RutterAlezandrew L Tationa Stech, III PA-C

## 2015-11-21 NOTE — Op Note (Signed)
Pre-operative diagnosis- Osteoarthritis Left knee(s)  Post-operative diagnosis- Osteoarthritis  Left knee(s)  Procedure-   Left Total Knee Arthroplasty  Surgeon- Gus RankinFrank V. Jebidiah Baggerly, MD  Assistant- Dimitri PedAmber Constable, PA-C   Anesthesia-  Spinal   EBL- * No blood loss amount entered *   Drains Hemovac x 1   Tourniquet time  Total Tourniquet Time Documented: Thigh (Left) - 34 minutes Total: Thigh (Left) - 34 minutes    Complications- None  Condition-PACU-stable condition  Brief Clinical Note  Lindsay Frazier is a 82 y.o. year old female with end stage OA of her right knee with progressively worsening pain and dysfunction. She has constant pain, with activity and at rest and significant functional deficits with difficulties even with ADLs. She has had extensive non-op management including analgesics, injections of cortisone, and home exercise program, but remains in significant pain with significant dysfunction.Radiographs show bone on bone arthritis lateral and patellofemoral with large valgus deformity. She presents now for right Total Knee Arthroplasty.    Procedure in detail---       The patient is brought into the operating room and positioned supine on the operating table. After successful administration of Spinal anesthetic, a tourniquet is placed high on the Left thigh(s) and the lower extremity is prepped and draped in the usual sterile fashion. Time out is performed by the operating team and then the Left  lower extremity is wrapped in Esmarch, knee flexed and the tourniquet inflated to 300 mmHg.       A midline incision is made with a ten blade through the subcutaneous tissue to the level of the extensor mechanism. A fresh blade is used to make a lateral parapatellar arthrotomy due to the patients' valgus deformity. Soft tissue over the proximal lateral tibia is subperiosteally elevated to the joint line with a knife to the posterolateral corner but not including the structures of  the posterolateral corner. Soft tissue over the proximal medial tibia is elevated with attention being paid to avoiding the patellar tendon on the tibial tubercle. The patella is everted medially, knee flexed 90 degrees and the ACL and PCL are removed. Findings are bone on boen lateral and patellofemoral with large global osteophytes. .       The drill is used to create a starting hole in the distal femur and the canal is thoroughly irrigated with sterile saline to remove the fatty contents. The 5 degree Left  valgus alignment guide is placed into the femoral canal and the distal femoral cutting block is pinned to remove 10  mm off the distal femur. Resection is made with an oscillating saw.      The tibia is subluxed forward and the menisci are removed. The extramedullary alignment guide is placed referencing proximally at the medial aspect of the tibial tubercle and distally along the second metatarsal axis and tibial crest. The block is pinned to remove 2mm off the more deficient lateral side. Resection is made with an oscillating saw. Size 3  is the most appropriate size for the tibia and the proximal tibia is prepared with the modular drill and keel punch for that size.      The femoral sizing guide is placed and size 4  is most appropriate. Rotation is marked off the epicondylar axis and confirmed by creating a rectangular flexion gap at 90 degrees. The size 4  cutting block is pinned in this rotation and the anterior, posterior and chamfer cuts are made with the oscillating saw. The intercondylar block  is then placed and that cut is made.      Trial size 3  tibial component, trial size 4 narrow posterior stabilized femur and a 10  mm posterior stabilized rotating platform insert trial is placed. Full extension is achieved with excellent varus/valgus and   anterior/posterior balance throughout full range of motion. The patella is everted and thickness measured to be 22  mm. Free hand resection is taken to  12 mm, a 38 template is placed, lug holes are drilled, trial patella is placed, and it tracks normally. Osteophytes are removed off the posterior femur with the trial in place. All trials are removed and the cut bone surfaces prepared with pulsatile lavage. Cement is mixed and once ready for implantation, the size 3  tibial implant, size 4 narrow posterior stabilized femoral component, and the size 38  patella are cemented in place and the patella is held with the clamp. The trial insert is placed and the knee held in full extension. The Exparel (20 ml mixed with 30 ml saline) and then 20 ml of .25% Bupivicaine is injected into the extensor mechanism, posterior capsule, medial and lateral gutters and subcutaneous tissues. All extruded cement is removed and once the cement is hard the permanent 10  mm posterior stabilized rotating platform insert is placed into the tibial tray.      The wound is copiously irrigated with saline solution and the tourniquet is released for a total   tourniquet time of 34  minutes. Bleeding is identified and controlled with electrocautery. The extensor mechanism is closed with interrupted #1 V-loc leaving open a small area from the superior to inferior pole of the patella to serve as a mini lateral release. Flexion against gravity is 140  degrees and the patella tracks normally. Subcutaneous tissue is closed with 2.0 vicryl and subcuticular with running 4.0 Monocryl.The incision is cleaned and dried and steri-strips and a bulky sterile dressing are applied. The limb is placed into a knee immobilizer and the patient is awakened and transported to recovery in stable condition.      Please note that a surgical assistant was a medical necessity for this procedure in order to perform it in a safe and expeditious manner. Surgical assistant was necessary to retract the ligaments and vital neurovascular structures to prevent injury to them and also necessary for proper positioning of the limb  to allow for anatomic placement of the prosthesis.    Gus RankinFrank V. Romelle Muldoon, MD    11/21/2015, 8:16 AM

## 2015-11-21 NOTE — Evaluation (Signed)
Physical Therapy Evaluation Patient Details Name: Lindsay Frazier MRN: [id number] DOB: Aug 12, 1933 Today's Date: 11/21/2015   History of Present Illness  s/p L TKA  Clinical Impression  Pt is s/p TKA resulting in the deficits listed below (see PT Problem List). * Pt will benefit from skilled PT to increase their independence and safety with mobility to allow discharge to the venue listed below. Pt amb 16' with RW and min assist this session--distnace limited by PT d/t incr pain with activity, should progress well; planning for HHPT; will need RW and 3in1; will continue to follow     Follow Up Recommendations Home health PT    Equipment Recommendations  Rolling walker with 5" wheels;3in1 (PT)    Recommendations for Other Services       Precautions / Restrictions Precautions Precautions: Knee Required Braces or Orthoses: Knee Immobilizer - Right Knee Immobilizer - Right: Discontinue once straight leg raise with < 10 degree lag Restrictions Other Position/Activity Restrictions: WBAT      Mobility  Bed Mobility Overal bed mobility: Needs Assistance Bed Mobility: Supine to Sit     Supine to sit: Min assist     General bed mobility comments: cues for technique, assist with LLE  Transfers Overall transfer level: Needs assistance Equipment used: Rolling walker (2 wheeled) Transfers: Sit to/from Stand Sit to Stand: Min assist         General transfer comment: cues for LLE management and hand placement  Ambulation/Gait Ambulation/Gait assistance: Min assist Ambulation Distance (Feet): 16 Feet Assistive device: Rolling walker (2 wheeled) Gait Pattern/deviations: Step-to pattern;Decreased step length - left;Decreased step length - right;Trunk flexed;Decreased weight shift to left     General Gait Details: cues for sequence, RW position; assist throughout for balance  Stairs            Wheelchair Mobility    Modified Rankin (Stroke Patients Only)        Balance                                             Pertinent Vitals/Pain Pain Assessment: 0-10 Pain Score: 4  Pain Location: left knee Pain Descriptors / Indicators: Aching;Sore Pain Intervention(s): Limited activity within patient's tolerance;Monitored during session;Premedicated before session;Repositioned    Home Living Family/patient expects to be discharged to:: Private residence Living Arrangements: Spouse/significant other Available Help at Discharge: Family;Available 24 hours/day Type of Home: House Home Access: Stairs to enter   Enter[employer] CorporationEntrance Stairs-Number of Steps: 1 Home Layout: Two level Home Equipment: None Additional Comments: plans to go upstairs and stay on second level    Prior Function Level of Independence: Independent               Hand Dominance        Extremity/Trunk Assessment   Upper Extremity Assessment: Defer to OT evaluation;Overall WFL for tasks assessed           Lower Extremity Assessment: LLE deficits/detail   LLE Deficits / Details: able to assist with SLR, ankle WFL; expected post op weakness     Communication   Communication: No difficulties  Cognition Arousal/Alertness: Awake/alert Behavior During Therapy: WFL for tasks assessed/performed Overall Cognitive Status: Within Functional Limits for tasks assessed                      General Comments      Exercises  Total Joint Exercises Ankle Circles/Pumps: AROM;Both;10 reps Quad Sets: Both;AROM;5 reps   Assessment/Plan    PT Assessment Patient needs continued PT services  PT Problem List Decreased strength;Decreased range of motion;Decreased activity tolerance;Decreased balance;Decreased mobility;Decreased knowledge of use of DME;Decreased knowledge of precautions;Pain          PT Treatment Interventions DME instruction;Gait training;Functional mobility training;Therapeutic exercise;Therapeutic activities;Patient/family education;Stair  training    PT Goals (Current goals can be found in the Care Plan section)  Acute Rehab PT Goals Patient Stated Goal: less pain with activity PT Goal Formulation: With patient Time For Goal Achievement: 11/25/15 Potential to Achieve Goals: Good    Frequency 7X/week   Barriers to discharge        Co-evaluation               End of Session Equipment Utilized During Treatment: Gait belt;Left knee immobilizer Activity Tolerance: Patient tolerated treatment well Patient left: in chair;with call bell/phone within reach;with chair alarm set Nurse Communication: Mobility status         Time: 1449-1520 PT Time Calculation (min) (ACUTE ONLY): 31 min   Charges:   PT Evaluation $PT Eval Low Complexity: 1 Procedure PT Treatments $Gait Training: 8-22 mins   PT G Codes:        Alvine Mostafa 11/21/2015, 6:05 PM

## 2015-11-21 NOTE — Anesthesia Preprocedure Evaluation (Signed)
Anesthesia Evaluation  Patient identified by MRN, date of birth, ID band Patient awake    Reviewed: Allergy & Precautions, NPO status , Patient's Chart, lab work & pertinent test results  Airway Mallampati: I       Dental no notable dental hx.    Pulmonary former smoker,    Pulmonary exam normal        Cardiovascular negative cardio ROS Normal cardiovascular exam     Neuro/Psych negative neurological ROS  negative psych ROS   GI/Hepatic Neg liver ROS,   Endo/Other  negative endocrine ROS  Renal/GU negative Renal ROS  negative genitourinary   Musculoskeletal   Abdominal Normal abdominal exam  (+)   Peds negative pediatric ROS (+)  Hematology negative hematology ROS (+)   Anesthesia Other Findings   Reproductive/Obstetrics negative OB ROS                             Anesthesia Physical Anesthesia Plan  ASA: II  Anesthesia Plan: Spinal   Post-op Pain Management:    Induction:   Airway Management Planned:   Additional Equipment:   Intra-op Plan:   Post-operative Plan:   Informed Consent: I have reviewed the patients History and Physical, chart, labs and discussed the procedure including the risks, benefits and alternatives for the proposed anesthesia with the patient or authorized representative who has indicated his/her understanding and acceptance.     Plan Discussed with: CRNA and Surgeon  Anesthesia Plan Comments:         Anesthesia Quick Evaluation

## 2015-11-21 NOTE — Transfer of Care (Signed)
Immediate Anesthesia Transfer of Care Note  Patient: Lindsay Frazier  Procedure(s) Performed: Procedure(s): LEFT TOTAL KNEE ARTHROPLASTY (Left)  Patient Location: PACU  Anesthesia Type:Spinal  Level of Consciousness:  sedated, patient cooperative and responds to stimulation  Airway & Oxygen Therapy:Patient Spontanous Breathing and Patient connected to face mask oxgen  Post-op Assessment:  Report given to PACU RN and Post -op Vital signs reviewed and stable  Post vital signs:  Reviewed and stable  Last Vitals:  Vitals:   11/21/15 0544  BP: (!) 151/70  Pulse: 73  Resp: 18  Temp: 36.6 C    Complications: No apparent anesthesia complications

## 2015-11-22 LAB — CBC
HEMATOCRIT: 32.1 % — AB (ref 36.0–46.0)
HEMOGLOBIN: 10.6 g/dL — AB (ref 12.0–15.0)
MCH: 30.1 pg (ref 26.0–34.0)
MCHC: 33 g/dL (ref 30.0–36.0)
MCV: 91.2 fL (ref 78.0–100.0)
Platelets: 167 10*3/uL (ref 150–400)
RBC: 3.52 MIL/uL — ABNORMAL LOW (ref 3.87–5.11)
RDW: 14.1 % (ref 11.5–15.5)
WBC: 8.1 10*3/uL (ref 4.0–10.5)

## 2015-11-22 LAB — BASIC METABOLIC PANEL
Anion gap: 5 (ref 5–15)
BUN: 7 mg/dL (ref 6–20)
CALCIUM: 8.2 mg/dL — AB (ref 8.9–10.3)
CHLORIDE: 106 mmol/L (ref 101–111)
CO2: 27 mmol/L (ref 22–32)
CREATININE: 0.55 mg/dL (ref 0.44–1.00)
GFR calc Af Amer: >60 mL/min (ref >60)
GFR calc non Af Amer: >60 mL/min (ref >60)
GLUCOSE: 101 mg/dL — AB (ref 65–99)
Potassium: 3.5 mmol/L (ref 3.5–5.1)
Sodium: 138 mmol/L (ref 135–145)

## 2015-11-22 MED ORDER — SODIUM CHLORIDE 0.9 % IV BOLUS (SEPSIS)
250.0000 mL | Freq: Once | INTRAVENOUS | Status: AC
  Administered 2015-11-22: 250 mL via INTRAVENOUS

## 2015-11-22 NOTE — Progress Notes (Signed)
   Subjective: 1 Day Post-Op Procedure(s) (LRB): LEFT TOTAL KNEE ARTHROPLASTY (Left) Patient reports pain as mild and moderate pain last night.  Better this morning. Denies nausea/SOB/CP at this time. Patient seen in rounds for Dr. Lequita HaltAluisio.  Daughter-in-law in room at bedside.   Patient is well, but has had some minor complaints of pain in the kne, requiring pain medications We will resume therapy today. She got up and walked 16 feet yesterday, day of surgery, with therapy. She looks good this morning and appears to be in good spirits. Plan is to go Home after hospital stay.  Objective: Vital signs in last 24 hours: Temp:  [97.4 F (36.3 C)-98.6 F (37 C)] 97.5 F (36.4 C) (11/07 0557) Pulse Rate:  [60-84] 60 (11/07 0557) Resp:  [15-16] 16 (11/07 0557) BP: (98-134)/(50-78) 101/50 (11/07 0557) SpO2:  [96 %-100 %] 100 % (11/07 0557)  Intake/Output from previous day:  Intake/Output Summary (Last 24 hours) at 11/22/15 1013 Last data filed at 11/22/15 0841  Gross per 24 hour  Intake          2938.75 ml  Output             3020 ml  Net           -81.25 ml    Intake/Output this shift: Total I/O In: 240 [P.O.:240] Out: -   Labs:  Recent Labs  11/22/15 0408  HGB 10.6*    Recent Labs  11/22/15 0408  WBC 8.1  RBC 3.52*  HCT 32.1*  PLT 167    Recent Labs  11/22/15 0408  NA 138  K 3.5  CL 106  CO2 27  BUN 7  CREATININE 0.55  GLUCOSE 101*  CALCIUM 8.2*   No results for input(s): LABPT, INR in the last 72 hours.  EXAM General - Patient is Alert, Appropriate and Oriented Extremity - Neurovascular intact Sensation intact distally Intact pulses distally Dorsiflexion/Plantar flexion intact Dressing - dressing C/D/I Motor Function - intact, moving foot and toes well on exam.  Hemovac pulled without difficulty.  Past Medical History:  Diagnosis Date  . Arthritis   . GERD (gastroesophageal reflux disease)   . Irregular heart beat   . Yeast infection of the  vagina    current on 11/14/2015 per patient     Assessment/Plan: 1 Day Post-Op Procedure(s) (LRB): LEFT TOTAL KNEE ARTHROPLASTY (Left) Principal Problem:   OA (osteoarthritis) of knee  Estimated body mass index is 21.63 kg/m as calculated from the following:   Height as of this encounter: 5\' 5"  (1.651 m).   Weight as of this encounter: 59 kg (130 lb). Advance diet Up with therapy Discharge home with home health when ready  DVT Prophylaxis - Xarelto Weight-Bearing as tolerated to left leg D/C O2 and Pulse OX and try on Room Air  HGB is 10.6 today.  Avel Peacerew Monserratt Knezevic, PA-C Orthopaedic Surgery 11/22/2015, 10:13 AM

## 2015-11-22 NOTE — Progress Notes (Signed)
   11/22/15 1400  PT Visit Information  Last PT Received On 11/22/15  Assistance Needed +1  History of Present Illness s/p L TKA  Subjective Data  Subjective I feel better  Patient Stated Goal less pain with activity  Precautions  Precautions Knee  Required Braces or Orthoses Knee Immobilizer - Right  Knee Immobilizer - Right Discontinue once straight leg raise with < 10 degree lag  Restrictions  Weight Bearing Restrictions No  Other Position/Activity Restrictions WBAT  Pain Assessment  Pain Score 5  Pain Location L knee  Pain Descriptors / Indicators Aching;Discomfort  Pain Intervention(s) Limited activity within patient's tolerance;Monitored during session;Patient requesting pain meds-RN notified;Ice applied  Cognition  Arousal/Alertness Awake/alert  Behavior During Therapy WFL for tasks assessed/performed  Overall Cognitive Status Within Functional Limits for tasks assessed  Bed Mobility  Overal bed mobility Needs Assistance  Bed Mobility Sit to Supine  Sit to supine Min assist  General bed mobility comments cues for technique, assist with LLE  Transfers  Overall transfer level Needs assistance  Equipment used Rolling walker (2 wheeled)  Transfers Sit to/from Stand  Sit to Stand Min guard;Min assist  General transfer comment cues for LLE management and hand placement  Ambulation/Gait  Ambulation/Gait assistance Min assist;Min guard  Ambulation Distance (Feet) 60 Feet (10' x2 )  Assistive device Rolling walker (2 wheeled)  Gait Pattern/deviations Step-to pattern;Narrow base of support;Trunk flexed  General Gait Details cues for sequence, RW position, upward gaze; assist throughout for balance  Balance  Overall balance assessment History of Falls (pt unsure how long ago but does report "a few" falls )  Standing balance-Leahy Scale Poor  PT - End of Session  Equipment Utilized During Treatment Gait belt;Left knee immobilizer  Activity Tolerance Patient tolerated  treatment well  Patient left in bed;with call bell/phone within reach;with bed alarm set  PT - Assessment/Plan  PT Plan Current plan remains appropriate  PT Frequency (ACUTE ONLY) 7X/week  Follow Up Recommendations Home health PT;Supervision for mobility/OOB  PT equipment Rolling walker with 5" wheels;3in1 (PT)  PT Goal Progression  Progress towards PT goals Progressing toward goals  Acute Rehab PT Goals  PT Goal Formulation With patient  Time For Goal Achievement 11/25/15  Potential to Achieve Goals Good  PT Time Calculation  PT Start Time (ACUTE ONLY) 1409  PT Stop Time (ACUTE ONLY) 1439  PT Time Calculation (min) (ACUTE ONLY) 30 min  PT General Charges  $$ ACUTE PT VISIT 1 Procedure  PT Treatments  $Gait Training 23-37 mins

## 2015-11-22 NOTE — Care Management Note (Addendum)
Case Management Note  Patient Details  Name: Lindsay Frazier MRN: [id number] Date of Birth: 05/26/1933  Subjective/Objective:                  L TKA Action/Plan: Discharge planning Expected Discharge Date:  11/23/15               Expected Discharge Plan:  [place]  In-House Referral:     Discharge planning Services  CM Consult  Post Acute Care Choice:  Home Health Choice offered to:  Patient, Adult Children  DME Arranged:  3-N-1, Walker rolling DME Agency:  [place]:  PT [place] Agency:  [facility] (formerly [facility])  Status of Service:  Completed, signed off  If discussed at [employer], dates discussed:    Additional Comments: Cm met with pt in room to offer choice of home health agency. Pt chooses [facility] to render HHPT.  Referral given to [facility] rep, [name] with address of [name], [place] [address] [phone] for scheduling HHPT.  CM notified [facility] DME rep, to please deliver the rolling walker and 3n1 to room prior to discharge.  No other CM needs were communicated. Dellie Catholic, RN 11/22/2015, 1:23 PM

## 2015-11-22 NOTE — Evaluation (Signed)
Occupational Therapy Evaluation Patient Details Name: Lindsay Frazier MRN: [id number] DOB: 30-Dec-1933 Today's Date: 11/22/2015    History of Present Illness s/p L TKA   Clinical Impression   This 82 year old female was admitted for the above sx.  She will benefit from one more session of OT to reinforce safety with toilet transfers, educate on shower transfers and further educate on AE, if she is interested in this for adls.  Goals are for supervision to min guard level    Follow Up Recommendations  Supervision/Assistance - 24 hour    Equipment Recommendations  3 in 1 bedside comode    Recommendations for Other Services       Precautions / Restrictions Precautions Precautions: Knee Required Braces or Orthoses: Knee Immobilizer - Right Knee Immobilizer - Right: Discontinue once straight leg raise with < 10 degree lag Restrictions Other Position/Activity Restrictions: WBAT      Mobility Bed Mobility               General bed mobility comments: oob  Transfers   Equipment used: Rolling walker (2 wheeled) Transfers: Sit to/from Stand Sit to Stand: Min guard         General transfer comment: cues for UE/LE placement    Balance                                            ADL Overall ADL's : Needs assistance/impaired     Grooming: Oral care;Wash/dry hands;Supervision/safety;Standing   Upper Body Bathing: Set up;Sitting   Lower Body Bathing: Minimal assistance;Sit to/from stand   Upper Body Dressing : Set up;Sitting   Lower Body Dressing: Moderate assistance;Sit to/from stand   Toilet Transfer: Minimal assistance;Ambulation;BSC;RW   Toileting- Clothing Manipulation and Hygiene: Minimal assistance;Sit to/from stand         General ADL Comments: performed ADL both at sink in room and finished in bathroom. Pt had a LOB when ambulating to commode.  Space was tight and she started to lift walker.  When seated, demonstrated side  stepping through tight space.  Did not use AE this session. Will further educate on next session.  Pt is unable to lift LLE on her own at this time     Vision     Perception     Praxis      Pertinent Vitals/Pain Pain Score: 4  Pain Location: L knee Pain Descriptors / Indicators: Aching Pain Intervention(s): Limited activity within patient's tolerance;Monitored during session;Premedicated before session;Repositioned;Ice applied     Hand Dominance     Extremity/Trunk Assessment Upper Extremity Assessment Upper Extremity Assessment: Overall WFL for tasks assessed           Communication Communication Communication: No difficulties   Cognition Arousal/Alertness: Awake/alert Behavior During Therapy: WFL for tasks assessed/performed Overall Cognitive Status: Within Functional Limits for tasks assessed                     General Comments       Exercises       Shoulder Instructions      Home Living Family/patient expects to be discharged to:: Private residence Living Arrangements: Spouse/significant other Available Help at Discharge: Family;Available 24 hours/day Type of Home: House             Bathroom Shower/Tub: Producer, television/film/videoWalk-in shower   Bathroom Toilet: Standard  Additional Comments: plans to go upstairs and stay on second level      Prior Functioning/Environment Level of Independence: Independent                 OT Problem List: Decreased strength;Decreased activity tolerance;Impaired balance (sitting and/or standing);Decreased knowledge of use of DME or AE;Pain   OT Treatment/Interventions: Self-care/ADL training;DME and/or AE instruction;Patient/family education;Balance training    OT Goals(Current goals can be found in the care plan section) Acute Rehab OT Goals Patient Stated Goal: less pain with activity OT Goal Formulation: With patient Time For Goal Achievement: 11/29/15 Potential to Achieve Goals: Good ADL Goals Pt Will  Transfer to Toilet: ambulating;bedside commode;with supervision Pt Will Perform Tub/Shower Transfer: Shower transfer;with min guard assist;ambulating;shower seat Additional ADL Goal #1: pt will demonstrate use of AE for adls vs verbalize understanding of use  OT Frequency: Min 2X/week   Barriers to D/C:            Co-evaluation              End of Session    Activity Tolerance: Patient tolerated treatment well Patient left: in chair;with call bell/phone within reach   Time: 1126-1205 OT Time Calculation (min): 39 min Charges:  OT General Charges $OT Visit: 1 Procedure OT Evaluation $OT Eval Low Complexity: 1 Procedure OT Treatments $Self Care/Home Management : 23-37 mins G-Codes:    Macguire Holsinger 11/22/2015, 12:51 PM Marica OtterMaryellen Dace Denn, OTR/L [phone] 11/22/2015

## 2015-11-22 NOTE — Progress Notes (Signed)
Physical Therapy Treatment Patient Details Name: Lindsay Frazier MRN: [id number] DOB: [birth year] Today's Date: 11/22/2015    History of Present Illness s/p L TKA    PT Comments    Progressing well with mobility; somewhat limited this am by nausea; will continue to follow;  Follow Up Recommendations  Home health PT;Supervision for mobility/OOB     Equipment Recommendations  Rolling walker with 5" wheels;3in1 (PT)    Recommendations for Other Services       Precautions / Restrictions Precautions Precautions: Knee Required Braces or Orthoses: Knee Immobilizer - Right Knee Immobilizer - Right: Discontinue once straight leg raise with < 10 degree lag Restrictions Weight Bearing Restrictions: No Other Position/Activity Restrictions: WBAT    Mobility  Bed Mobility   Bed Mobility: Supine to Sit     Supine to sit: Min assist     General bed mobility comments: cues for technique, assist with LLE  Transfers Overall transfer level: Needs assistance Equipment used: Rolling walker (2 wheeled) Transfers: Sit to/from Stand Sit to Stand: Min guard         General transfer comment: cues for LLE management and hand placement  Ambulation/Gait Ambulation/Gait assistance: Min assist Ambulation Distance (Feet): 15 Feet (12' more) Assistive device: Rolling walker (2 wheeled) Gait Pattern/deviations: Step-to pattern;Narrow base of support;Antalgic;Decreased weight shift to left;Decreased step length - right;Decreased step length - left     General Gait Details: cues for sequence, RW position; assist throughout for balance   Stairs            Wheelchair Mobility    Modified Rankin (Stroke Patients Only)       Balance Overall balance assessment: Needs assistance           Standing balance-Leahy Scale: Poor Standing balance comment: reliant on UEs for balance                    Cognition Arousal/Alertness: Awake/alert Behavior During Therapy: WFL  for tasks assessed/performed Overall Cognitive Status: Within Functional Limits for tasks assessed                      Exercises Total Joint Exercises Ankle Circles/Pumps: AROM;Both;10 reps Quad Sets: Both;AROM;10 reps Short Arc QuadBarbaraann Boys: AAROM;Left;10 reps Heel Slides: Left;10 reps;AAROM Hip ABduction/ADduction: AROM;Left;10 reps Straight Leg Raises: AAROM;10 reps;Left    General Comments        Pertinent Vitals/Pain Pain Score: 5  Pain Location: L knee Pain Descriptors / Indicators: Sore Pain Intervention(s): Limited activity within patient's tolerance;Monitored during session;Premedicated before session;Repositioned    Home Living Family/patient expects to be discharged to:: Private residence Living Arrangements: Spouse/significant other Available Help at Discharge: Family;Available 24 hours/day Type of Home: House         Additional Comments: plans to go upstairs and stay on second level    Prior Function Level of Independence: Independent          PT Goals (current goals can now be found in the care plan section) Acute Rehab PT Goals Patient Stated Goal: less pain with activity PT Goal Formulation: With patient Time For Goal Achievement: 11/25/15 Potential to Achieve Goals: Good Progress towards PT goals: Progressing toward goals    Frequency    7X/week      PT Plan Current plan remains appropriate    Co-evaluation             End of Session Equipment Utilized During Treatment: Gait belt;Left knee immobilizer Activity Tolerance: Patient tolerated treatment  well Patient left: in chair;with call bell/phone within reach;with chair alarm set     Time: 1026-1057 PT Time Calculation (min) (ACUTE ONLY): 31 min  Charges:  $Gait Training: 8-22 mins $Therapeutic Exercise: 8-22 mins                    G Codes:      Lindsay Frazier 11/22/2015, 1:16 PM

## 2015-11-22 NOTE — Progress Notes (Signed)
Pt is not having pain relief after IV morphine given. Notified on-call PA Algernon HuxleyMatthew Babbish received orders for IV dilaudid 0.5-1mg  and to discontinue IV morphine. Will continue to monitor.

## 2015-11-22 NOTE — Discharge Instructions (Addendum)
° °Dr. Frank Aluisio °Total Joint Specialist °[facility] °[address] °[place], [place] [address] °[phone] ° °TOTAL KNEE REPLACEMENT POSTOPERATIVE DIRECTIONS ° °Knee Rehabilitation, Guidelines Following Surgery  °Results after knee surgery are often greatly improved when you follow the exercise, range of motion and muscle strengthening exercises prescribed by your doctor. Safety measures are also important to protect the knee from further injury. Any time any of these exercises cause you to have increased pain or swelling in your knee joint, decrease the amount until you are comfortable again and slowly increase them. If you have problems or questions, call your caregiver or physical therapist for advice.  ° °HOME CARE INSTRUCTIONS  °Remove items at home which could result in a fall. This includes throw rugs or furniture in walking pathways.  °· ICE to the affected knee every three hours for 30 minutes at a time and then as needed for pain and swelling.  Continue to use ice on the knee for pain and swelling from surgery. You may notice swelling that will progress down to the foot and ankle.  This is normal after surgery.  Elevate the leg when you are not up walking on it.   °· Continue to use the breathing machine which will help keep your temperature down.  It is common for your temperature to cycle up and down following surgery, especially at night when you are not up moving around and exerting yourself.  The breathing machine keeps your lungs expanded and your temperature down. °· Do not place pillow under knee, focus on keeping the knee straight while resting ° °DIET °You may resume your previous home diet once your are discharged from the hospital. ° °DRESSING / WOUND CARE / SHOWERING °You may shower 3 days after surgery, but keep the wounds dry during showering.  You may use an occlusive plastic wrap (Press'n Seal for example), NO SOAKING/SUBMERGING IN THE BATHTUB.  If the  bandage gets wet, change with a clean dry gauze.  If the incision gets wet, pat the wound dry with a clean towel. °You may start showering once you are discharged home but do not submerge the incision under water. Just pat the incision dry and apply a dry gauze dressing on daily. °Change the surgical dressing daily and reapply a dry dressing each time. ° °ACTIVITY °Walk with your walker as instructed. °Use walker as long as suggested by your caregivers. °Avoid periods of inactivity such as sitting longer than an hour when not asleep. This helps prevent blood clots.  °You may resume a sexual relationship in one month or when given the OK by your doctor.  °You may return to work once you are cleared by your doctor.  °Do not drive a car for 6 weeks or until released by you surgeon.  °Do not drive while taking narcotics. ° °WEIGHT BEARING °Weight bearing as tolerated with assist device (walker, cane, etc) as directed, use it as long as suggested by your surgeon or therapist, typically at least 4-6 weeks. ° °POSTOPERATIVE CONSTIPATION PROTOCOL °Constipation - defined medically as fewer than three stools per week and severe constipation as less than one stool per week. ° °One of the most common issues patients have following surgery is constipation.  Even if you have a regular bowel pattern at home, your normal regimen is likely to be disrupted due to multiple reasons following surgery.  Combination of anesthesia, postoperative narcotics, change in appetite and fluid intake all can affect your bowels.    In order to avoid complications following surgery, here are some recommendations in order to help you during your recovery period. ° °Colace (docusate) - Pick up an over-the-counter form of Colace or another stool softener and take twice a day as long as you are requiring postoperative pain medications.  Take with a full glass of water daily.  If you experience loose stools or diarrhea, hold the colace until you stool forms  back up.  If your symptoms do not get better within 1 week or if they get worse, check with your doctor. ° °Dulcolax (bisacodyl) - Pick up over-the-counter and take as directed by the product packaging as needed to assist with the movement of your bowels.  Take with a full glass of water.  Use this product as needed if not relieved by Colace only.  ° °MiraLax (polyethylene glycol) - Pick up over-the-counter to have on hand.  MiraLax is a solution that will increase the amount of water in your bowels to assist with bowel movements.  Take as directed and can mix with a glass of water, juice, soda, coffee, or tea.  Take if you go more than two days without a movement. °Do not use MiraLax more than once per day. Call your doctor if you are still constipated or irregular after using this medication for 7 days in a row. ° °If you continue to have problems with postoperative constipation, please contact the office for further assistance and recommendations.  If you experience "the worst abdominal pain ever" or develop nausea or vomiting, please contact the office immediatly for further recommendations for treatment. ° °ITCHING ° If you experience itching with your medications, try taking only a single pain pill, or even half a pain pill at a time.  You can also use Benadryl over the counter for itching or also to help with sleep.  ° °TED HOSE STOCKINGS °Wear the elastic stockings on both legs for three weeks following surgery during the day but you may remove then at night for sleeping. ° °MEDICATIONS °See your medication summary on the “After Visit Summary” that the nursing staff will review with you prior to discharge.  You may have some home medications which will be placed on hold until you complete the course of blood thinner medication.  It is important for you to complete the blood thinner medication as prescribed by your surgeon.  Continue your approved medications as instructed at time of  discharge. ° °PRECAUTIONS °If you experience chest pain or shortness of breath - call 911 immediately for transfer to the hospital emergency department.  °If you develop a fever greater that 101 F, purulent drainage from wound, increased redness or drainage from wound, foul odor from the wound/dressing, or calf pain - CONTACT YOUR SURGEON.   °                                                °FOLLOW-UP APPOINTMENTS °Make sure you keep all of your appointments after your operation with your surgeon and caregivers. You should call the office at the above phone number and make an appointment for approximately two weeks after the date of your surgery or on the date instructed by your surgeon outlined in the "After Visit Summary". ° ° °RANGE OF MOTION AND STRENGTHENING EXERCISES  °Rehabilitation of the knee is important following a knee injury or   an operation. After just a few days of immobilization, the muscles of the thigh which control the knee become weakened and shrink (atrophy). Knee exercises are designed to build up the tone and strength of the thigh muscles and to improve knee motion. Often times heat used for twenty to thirty minutes before working out will loosen up your tissues and help with improving the range of motion but do not use heat for the first two weeks following surgery. These exercises can be done on a training (exercise) mat, on the floor, on a table or on a bed. Use what ever works the best and is most comfortable for you Knee exercises include:  °Leg Lifts - While your knee is still immobilized in a splint or cast, you can do straight leg raises. Lift the leg to 60 degrees, hold for 3 sec, and slowly lower the leg. Repeat 10-20 times 2-3 times daily. Perform this exercise against resistance later as your knee gets better.  °Quad and Hamstring Sets - Tighten up the muscle on the front of the thigh (Quad) and hold for 5-10 sec. Repeat this 10-20 times hourly. Hamstring sets are done by pushing the  foot backward against an object and holding for 5-10 sec. Repeat as with quad sets.  °· Leg Slides: Lying on your back, slowly slide your foot toward your buttocks, bending your knee up off the floor (only go as far as is comfortable). Then slowly slide your foot back down until your leg is flat on the floor again. °· Angel Wings: Lying on your back spread your legs to the side as far apart as you can without causing discomfort.  °A rehabilitation program following serious knee injuries can speed recovery and prevent re-injury in the future due to weakened muscles. Contact your doctor or a physical therapist for more information on knee rehabilitation.  ° °IF YOU ARE TRANSFERRED TO A SKILLED REHAB FACILITY °If the patient is transferred to a skilled rehab facility following release from the hospital, a list of the current medications will be sent to the facility for the patient to continue.  When discharged from the skilled rehab facility, please have the facility set up the patient's Home Health Physical Therapy prior to being released. Also, the skilled facility will be responsible for providing the patient with their medications at time of release from the facility to include their pain medication, the muscle relaxants, and their blood thinner medication. If the patient is still at the rehab facility at time of the two week follow up appointment, the skilled rehab facility will also need to assist the patient in arranging follow up appointment in our office and any transportation needs. ° °MAKE SURE YOU:  °Understand these instructions.  °Get help right away if you are not doing well or get worse.  ° ° °Pick up stool softner and laxative for home use following surgery while on pain medications. °Do not submerge incision under water. °Please use good hand washing techniques while changing dressing each day. °May shower starting three days after surgery. °Please use a clean towel to pat the incision dry following  showers. °Continue to use ice for pain and swelling after surgery. °Do not use any lotions or creams on the incision until instructed by your surgeon. ° °Take Xarelto for two and a half more weeks, then discontinue Xarelto. °Once the patient has completed the Xarelto, they may resume the 81 mg Aspirin. ° ° ° °Information on my medicine - XARELTO® (Rivaroxaban) ° °  This medication education was reviewed with me or my healthcare representative as part of my discharge preparation.  The pharmacist that spoke with me during my hospital stay was:  Glogovac,nikola, RPH ° °Why was Xarelto® prescribed for you? °Xarelto® was prescribed for you to reduce the risk of blood clots forming after orthopedic surgery. The medical term for these abnormal blood clots is venous thromboembolism (VTE). ° °What do you need to know about xarelto® ? °Take your Xarelto® ONCE DAILY at the same time every day. °You may take it either with or without food. ° °If you have difficulty swallowing the tablet whole, you may crush it and mix in applesauce just prior to taking your dose. ° °Take Xarelto® exactly as prescribed by your doctor and DO NOT stop taking Xarelto® without talking to the doctor who prescribed the medication.  Stopping without other VTE prevention medication to take the place of Xarelto® may increase your risk of developing a clot. ° °After discharge, you should have regular check-up appointments with your healthcare provider that is prescribing your Xarelto®.   ° °What do you do if you miss a dose? °If you miss a dose, take it as soon as you remember on the same day then continue your regularly scheduled once daily regimen the next day. Do not take two doses of Xarelto® on the same day.  ° °Important Safety Information °A possible side effect of Xarelto® is bleeding. You should call your healthcare provider right away if you experience any of the following: °? Bleeding from an injury or your nose that does not stop. °? Unusual  colored urine (red or dark brown) or unusual colored stools (red or black). °? Unusual bruising for unknown reasons. °? A serious fall or if you hit your head (even if there is no bleeding). ° °Some medicines may interact with Xarelto® and might increase your risk of bleeding while on Xarelto®. To help avoid this, consult your healthcare provider or pharmacist prior to using any new prescription or non-prescription medications, including herbals, vitamins, non-steroidal anti-inflammatory drugs (NSAIDs) and supplements. ° °This website has more information on Xarelto®: www.xarelto.com. ° ° °

## 2015-11-23 LAB — BASIC METABOLIC PANEL
Anion gap: 7 (ref 5–15)
BUN: 7 mg/dL (ref 6–20)
CO2: 26 mmol/L (ref 22–32)
CREATININE: 0.49 mg/dL (ref 0.44–1.00)
Calcium: 8.1 mg/dL — ABNORMAL LOW (ref 8.9–10.3)
Chloride: 107 mmol/L (ref 101–111)
GFR calc Af Amer: >60 mL/min (ref >60)
GLUCOSE: 96 mg/dL (ref 65–99)
Potassium: 3.8 mmol/L (ref 3.5–5.1)
SODIUM: 140 mmol/L (ref 135–145)

## 2015-11-23 LAB — CBC
HCT: 32.2 % — ABNORMAL LOW (ref 36.0–46.0)
Hemoglobin: 10.6 g/dL — ABNORMAL LOW (ref 12.0–15.0)
MCH: 30.4 pg (ref 26.0–34.0)
MCHC: 32.9 g/dL (ref 30.0–36.0)
MCV: 92.3 fL (ref 78.0–100.0)
PLATELETS: 173 10*3/uL (ref 150–400)
RBC: 3.49 MIL/uL — ABNORMAL LOW (ref 3.87–5.11)
RDW: 14.3 % (ref 11.5–15.5)
WBC: 7.2 10*3/uL (ref 4.0–10.5)

## 2015-11-23 MED ORDER — RIVAROXABAN 10 MG PO TABS: 10.0000 mg | ORAL_TABLET | Freq: Every day | ORAL | 0 refills | Status: DC

## 2015-11-23 MED ORDER — TRAMADOL HCL 50 MG PO TABS: 50.0000 mg | ORAL_TABLET | Freq: Four times a day (QID) | ORAL | 1 refills | Status: DC | PRN

## 2015-11-23 MED ORDER — OXYCODONE HCL 5 MG PO TABS: 5.0000 mg | ORAL_TABLET | ORAL | 0 refills | Status: DC | PRN

## 2015-11-23 MED ORDER — METHOCARBAMOL 500 MG PO TABS: 500.0000 mg | ORAL_TABLET | Freq: Four times a day (QID) | ORAL | 0 refills | Status: DC | PRN

## 2015-11-23 NOTE — Progress Notes (Signed)
Physical Therapy Treatment Patient Details Name: Lindsay Frazier MRN: [id number] DOB: 1933-04-27 Today's Date: 11/23/2015    History of Present Illness s/p L TKA    PT Comments    POD # 2  Assisted with amb in hallway, practiced on e step, instructed to wear KI as pt was unable to perform active SLR, performed all supine TE's following HEP TKR.  Instructed on proper tech and freq as well as use of ICE.    Follow Up Recommendations   HH     Equipment Recommendations    RW   Recommendations for Other Services       Precautions / Restrictions Precautions Precautions: Knee Precaution Comments: instructed to wear KI for amb and stairs Required Braces or Orthoses: Knee Immobilizer - Right Knee Immobilizer - Right: Discontinue once straight leg raise with < 10 degree lag Restrictions Weight Bearing Restrictions: No Other Position/Activity Restrictions: WBAT    Mobility  Bed Mobility         Supine to sit: Min assist Sit to supine: Min assist   General bed mobility comments: NT OOB in recliner  Transfers Overall transfer level: Needs assistance Equipment used: Rolling walker (2 wheeled) Transfers: Sit to/from Stand Sit to Stand: Supervision;Min guard         General transfer comment: <25% VC's on proper hand placement esp with stand to sit  Ambulation/Gait Ambulation/Gait assistance: Supervision;Min guard Ambulation Distance (Feet): 75 Feet Assistive device: Rolling walker (2 wheeled) Gait Pattern/deviations: Step-to pattern;Decreased stance time - left Gait velocity: decreased   General Gait Details: cues for sequence, RW position, upward gaze; assist throughout for balance   Stairs            Wheelchair Mobility    Modified Rankin (Stroke Patients Only)       Balance                                    Cognition Arousal/Alertness: Awake/alert Behavior During Therapy: WFL for tasks assessed/performed Overall Cognitive  Status: Within Functional Limits for tasks assessed                      Exercises   Total Knee Replacement TE's 10 reps B LE ankle pumps 10 reps towel squeezes 10 reps knee presses 10 reps heel slides  10 reps SAQ's 10 reps SLR's 10 reps ABD Followed by ICE     General Comments        Pertinent Vitals/Pain Pain Assessment: No/denies pain Pain Score: 5  Pain Location: L knee Pain Descriptors / Indicators: Discomfort;Sore Pain Intervention(s): Monitored during session;Repositioned;Ice applied    Home Living                      Prior Function            PT Goals (current goals can now be found in the care plan section) Progress towards PT goals: Progressing toward goals    Frequency           PT Plan      Co-evaluation             End of Session in recliner            Time: [id number] PT Time Calculation (min) (ACUTE ONLY): 40 min  Charges:  $Gait Training: 8-22 mins $Therapeutic Exercise: 8-22 mins $Therapeutic Activity: 8-22 mins  G Codes:      Felecia ShellingLori Remas Sobel  PTA [facility]  Acute  Rehab Pager      [phone]   .

## 2015-11-23 NOTE — Progress Notes (Signed)
Occupational Therapy Treatment Patient Details Name: Lindsay Frazier MRN: [id number] DOB: Mar 19, 1933 Today's Date: 11/23/2015    History of present illness s/p L TKA   OT comments  Completed education this session.  Min guard for transfers for safety  Follow Up Recommendations  Supervision/Assistance - 24 hour    Equipment Recommendations  3 in 1 bedside comode    Recommendations for Other Services      Precautions / Restrictions Precautions Precautions: Knee Required Braces or Orthoses: Knee Immobilizer - Right Knee Immobilizer - Right: Discontinue once straight leg raise with < 10 degree lag Restrictions Other Position/Activity Restrictions: WBAT       Mobility Bed Mobility         Supine to sit: Min assist Sit to supine: Min assist      Transfers   Equipment used: Rolling walker (2 wheeled) Transfers: Sit to/from Stand Sit to Stand: Min guard         General transfer comment: cues for LE placement    Balance                                   ADL                           Toilet Transfer: Min guard;Ambulation;BSC;RW   Toileting- ArchitectClothing Manipulation and Hygiene: Min guard;Sit to/from stand   Tub/ Shower Transfer: Walk-in shower;Min guard;Ambulation     General ADL Comments: educated on AE and pt tried Sports administratorreacher; she needs to cross RLE under L to pull pants up L LE.  May just have family help. Did well with sidestepping through tight space to toilet and practiced shower transfer, simulating small ledge.  Pt reports our ledge is higher than the one she will use.  Gave handout on shower transfer.  Reviewed precautions and use of KI      Vision                     Perception     Praxis      Cognition   Behavior During Therapy: WFL for tasks assessed/performed Overall Cognitive Status: Within Functional Limits for tasks assessed                       Extremity/Trunk Assessment                Exercises     Shoulder Instructions       General Comments      Pertinent Vitals/ Pain       Pain Score: 5  Pain Location: L knee Pain Descriptors / Indicators: Sore Pain Intervention(s): Limited activity within patient's tolerance;Monitored during session;Premedicated before session;Repositioned;Ice applied  Home Living                                          Prior Functioning/Environment              Frequency           Progress Toward Goals  OT Goals(current goals can now be found in the care plan section)  Progress towards OT goals: Progressing toward goals (does not need any further OT)     Plan      Co-evaluation  End of Session     Activity Tolerance Patient tolerated treatment well   Patient Left in bed;with call bell/phone within reach   Nurse Communication          Time: 0822-0852 OT Time Calculation (min): 30 min  Charges: OT General Charges $OT Visit: 1 Procedure OT Treatments $Self Care/Home Management : 23-37 mins  Creedon Danielski 11/23/2015, 9:15 AM   Lindsay Frazier, OTR/L [phone] 11/23/2015

## 2015-11-23 NOTE — Discharge Summary (Signed)
Physician Discharge Summary   Patient ID: Lindsay Frazier MRN: [id number] DOB/AGE: 06/29/1933 82 y.o.  Admit date: 11/21/2015 Discharge date: 11/23/2015  Primary Diagnosis:  Osteoarthritis Left knee(s)  Admission Diagnoses:  Past Medical History:  Diagnosis Date  . Arthritis   . GERD (gastroesophageal reflux disease)   . Irregular heart beat   . Yeast infection of the vagina    current on 11/14/2015 per patient    Discharge Diagnoses:   Principal Problem:   OA (osteoarthritis) of knee  Estimated body mass index is 21.63 kg/m as calculated from the following:   Height as of this encounter: '5\' 5"'  (1.651 m).   Weight as of this encounter: 59 kg (130 lb).  Procedure:  Procedure(s) (LRB): LEFT TOTAL KNEE ARTHROPLASTY (Left)   Consults: None  HPI: Lindsay Frazier is a 82 y.o. year old female with end stage OA of her right knee with progressively worsening pain and dysfunction. She has constant pain, with activity and at rest and significant functional deficits with difficulties even with ADLs. She has had extensive non-op management including analgesics, injections of cortisone, and home exercise program, but remains in significant pain with significant dysfunction.Radiographs show bone on bone arthritis lateral and patellofemoral with large valgus deformity. She presents now for right Total Knee Arthroplasty.   Laboratory Data: Admission on 11/23/2015  Component Date Value Ref Range Status  . WBC 11/22/2015 8.1  4.0 - 10.5 K/uL Final  . RBC 11/22/2015 3.52* 3.87 - 5.11 MIL/uL Final  . Hemoglobin 11/22/2015 10.6* 12.0 - 15.0 g/dL Final  . HCT 11/22/2015 32.1* 36.0 - 46.0 % Final  . MCV 11/22/2015 91.2  78.0 - 100.0 fL Final  . MCH 11/22/2015 30.1  26.0 - 34.0 pg Final  . MCHC 11/22/2015 33.0  30.0 - 36.0 g/dL Final  . RDW 11/22/2015 14.1  11.5 - 15.5 % Final  . Platelets 11/22/2015 167  150 - 400 K/uL Final  . Sodium 11/22/2015 138  135 - 145 mmol/L Final  . Potassium  11/22/2015 3.5  3.5 - 5.1 mmol/L Final  . Chloride 11/22/2015 106  101 - 111 mmol/L Final  . CO2 11/22/2015 27  22 - 32 mmol/L Final  . Glucose, Bld 11/22/2015 101* 65 - 99 mg/dL Final  . BUN 11/22/2015 7  6 - 20 mg/dL Final  . Creatinine, Ser 11/22/2015 0.55  0.44 - 1.00 mg/dL Final  . Calcium 11/22/2015 8.2* 8.9 - 10.3 mg/dL Final  . GFR calc non Af Amer 11/22/2015 >60  >60 mL/min Final  . GFR calc Af Amer 11/22/2015 >60  >60 mL/min Final   Comment: (NOTE) The eGFR has been calculated using the CKD EPI equation. This calculation has not been validated in all clinical situations. eGFR's persistently <60 mL/min signify possible Chronic Kidney Disease.   . Anion gap 11/22/2015 5  5 - 15 Final  . WBC 11/23/2015 7.2  4.0 - 10.5 K/uL Final  . RBC 11/23/2015 3.49* 3.87 - 5.11 MIL/uL Final  . Hemoglobin 11/23/2015 10.6* 12.0 - 15.0 g/dL Final  . HCT 11/23/2015 32.2* 36.0 - 46.0 % Final  . MCV 11/23/2015 92.3  78.0 - 100.0 fL Final  . MCH 11/23/2015 30.4  26.0 - 34.0 pg Final  . MCHC 11/23/2015 32.9  30.0 - 36.0 g/dL Final  . RDW 11/23/2015 14.3  11.5 - 15.5 % Final  . Platelets 11/23/2015 173  150 - 400 K/uL Final  . Sodium 11/23/2015 140  135 - 145 mmol/L Final  .  Potassium 11/23/2015 3.8  3.5 - 5.1 mmol/L Final  . Chloride 11/23/2015 107  101 - 111 mmol/L Final  . CO2 11/23/2015 26  22 - 32 mmol/L Final  . Glucose, Bld 11/23/2015 96  65 - 99 mg/dL Final  . BUN 11/23/2015 7  6 - 20 mg/dL Final  . Creatinine, Ser 11/23/2015 0.49  0.44 - 1.00 mg/dL Final  . Calcium 11/23/2015 8.1* 8.9 - 10.3 mg/dL Final  . GFR calc non Af Amer 11/23/2015 >60  >60 mL/min Final  . GFR calc Af Amer 11/23/2015 >60  >60 mL/min Final   Comment: (NOTE) The eGFR has been calculated using the CKD EPI equation. This calculation has not been validated in all clinical situations. eGFR's persistently <60 mL/min signify possible Chronic Kidney Disease.   Georgiann Hahn gap 11/23/2015 7  5 - 15 Final  Hospital  Outpatient Visit on 11/14/2015  Component Date Value Ref Range Status  . aPTT 11/14/2015 31  24 - 36 seconds Final  . WBC 11/14/2015 5.2  4.0 - 10.5 K/uL Final  . RBC 11/14/2015 4.74  3.87 - 5.11 MIL/uL Final  . Hemoglobin 11/14/2015 14.1  12.0 - 15.0 g/dL Final  . HCT 11/14/2015 42.6  36.0 - 46.0 % Final  . MCV 11/14/2015 89.9  78.0 - 100.0 fL Final  . MCH 11/14/2015 29.7  26.0 - 34.0 pg Final  . MCHC 11/14/2015 33.1  30.0 - 36.0 g/dL Final  . RDW 11/14/2015 13.7  11.5 - 15.5 % Final  . Platelets 11/14/2015 221  150 - 400 K/uL Final  . Sodium 11/14/2015 139  135 - 145 mmol/L Final  . Potassium 11/14/2015 4.7  3.5 - 5.1 mmol/L Final  . Chloride 11/14/2015 106  101 - 111 mmol/L Final  . CO2 11/14/2015 26  22 - 32 mmol/L Final  . Glucose, Bld 11/14/2015 95  65 - 99 mg/dL Final  . BUN 11/14/2015 18  6 - 20 mg/dL Final  . Creatinine, Ser 11/14/2015 0.65  0.44 - 1.00 mg/dL Final  . Calcium 11/14/2015 9.0  8.9 - 10.3 mg/dL Final  . Total Protein 11/14/2015 6.7  6.5 - 8.1 g/dL Final  . Albumin 11/14/2015 3.8  3.5 - 5.0 g/dL Final  . AST 11/14/2015 18  15 - 41 U/L Final  . ALT 11/14/2015 13* 14 - 54 U/L Final  . Alkaline Phosphatase 11/14/2015 51  38 - 126 U/L Final  . Total Bilirubin 11/14/2015 0.7  0.3 - 1.2 mg/dL Final  . GFR calc non Af Amer 11/14/2015 >60  >60 mL/min Final  . GFR calc Af Amer 11/14/2015 >60  >60 mL/min Final   Comment: (NOTE) The eGFR has been calculated using the CKD EPI equation. This calculation has not been validated in all clinical situations. eGFR's persistently <60 mL/min signify possible Chronic Kidney Disease.   . Anion gap 11/14/2015 7  5 - 15 Final  . Prothrombin Time 11/14/2015 12.2  11.4 - 15.2 seconds Final  . INR 11/14/2015 0.91   Final  . ABO/RH(D) 11/21/2015 O POS   Final  . Antibody Screen 11/21/2015 NEG   Final  . Sample Expiration 11/21/2015 11/24/2015   Final  . Extend sample reason 11/21/2015 NO TRANSFUSIONS OR PREGNANCY IN THE PAST 3 MONTHS    Final  . Color, Urine 11/14/2015 YELLOW  YELLOW Final  . APPearance 11/14/2015 CLEAR  CLEAR Final  . Specific Gravity, Urine 11/14/2015 1.020  1.005 - 1.030 Final  . pH 11/14/2015 5.5  5.0 -  8.0 Final  . Glucose, UA 11/14/2015 NEGATIVE  NEGATIVE mg/dL Final  . Hgb urine dipstick 11/14/2015 NEGATIVE  NEGATIVE Final  . Bilirubin Urine 11/14/2015 NEGATIVE  NEGATIVE Final  . Ketones, ur 11/14/2015 NEGATIVE  NEGATIVE mg/dL Final  . Protein, ur 11/14/2015 NEGATIVE  NEGATIVE mg/dL Final  . Nitrite 11/14/2015 NEGATIVE  NEGATIVE Final  . Leukocytes, UA 11/14/2015 NEGATIVE  NEGATIVE Final  . MRSA, PCR 11/14/2015 NEGATIVE  NEGATIVE Final  . Staphylococcus aureus 11/14/2015 POSITIVE* NEGATIVE Final   Comment:        The Xpert SA Assay (FDA approved for NASAL specimens in patients over 35 years of age), is one component of a comprehensive surveillance program.  Test performance has been validated by [facility] for patients greater than or equal to 32 year old. It is not intended to diagnose infection nor to guide or monitor treatment.   . ABO/RH(D) 11/14/2015 O POS   Final     X-Rays:No results found.  EKG: Orders placed or performed during the hospital encounter of 11/21/15  . EKG 12-Lead  . EKG 12-Lead  . EKG 12-Lead  . EKG 12-Lead  . EKG 12-Lead  . EKG 12-Lead     Hospital Course: Lindsay Frazier is a 82 y.o. who was admitted to [facility]. They were brought to the operating room on 11/21/2015 and underwent Procedure(s): LEFT TOTAL KNEE ARTHROPLASTY.  Patient tolerated the procedure well and was later transferred to the recovery room and then to the orthopaedic floor for postoperative care.  They were given PO and IV analgesics for pain control following their surgery.  They were given 24 hours of postoperative antibiotics of  Anti-infectives    Start     Dose/Rate Route Frequency Ordered Stop   11/21/15 1400  ceFAZolin (ANCEF) IVPB 2g/100 mL premix     2 g 200 mL/hr  over 30 Minutes Intravenous Every 6 hours 11/21/15 0940 11/21/15 2107   11/21/15 0541  ceFAZolin (ANCEF) IVPB 2g/100 mL premix     2 g 200 mL/hr over 30 Minutes Intravenous On call to O.R. 11/21/15 0541 11/21/15 0725     and started on DVT prophylaxis in the form of Xarelto.   PT and OT were ordered for total joint protocol.  Discharge planning consulted to help with postop disposition and equipment needs.  Patient had a tough night on the evening of surgery but doping fairly well on day one.  They started to get up OOB with therapy on day one. Hemovac drain was pulled without difficulty.  Continued to work with therapy into day two.  Dressing was changed on day two and the incision was healing well.Patient was seen in rounds on POD 2 by Dr. Wynelle Link and was ready to go home.  Discharge home with home health Diet - Regular diet Follow up - in 2 weeks Activity - WBAT Disposition - Home Condition Upon Discharge - Good D/C Meds - See DC Summary DVT Prophylaxis - Xarelto  Discharge Instructions    Call MD / Call 911    Complete by:  As directed    If you experience chest pain or shortness of breath, CALL 911 and be transported to the hospital emergency room.  If you develope a fever above 101 F, pus (white drainage) or increased drainage or redness at the wound, or calf pain, call your surgeon's office.   Change dressing    Complete by:  As directed    Change dressing daily with sterile  4 x 4 inch gauze dressing and apply TED hose. Do not submerge the incision under water.   Constipation Prevention    Complete by:  As directed    Drink plenty of fluids.  Prune juice may be helpful.  You may use a stool softener, such as Colace (over the counter) 100 mg twice a day.  Use MiraLax (over the counter) for constipation as needed.   Diet general    Complete by:  As directed    Discharge instructions    Complete by:  As directed    Pick up stool softner and laxative for home use following surgery  while on pain medications. Do not submerge incision under water. Please use good hand washing techniques while changing dressing each day. May shower starting three days after surgery. Please use a clean towel to pat the incision dry following showers. Continue to use ice for pain and swelling after surgery. Do not use any lotions or creams on the incision until instructed by your surgeon.   Postoperative Constipation Protocol  Constipation - defined medically as fewer than three stools per week and severe constipation as less than one stool per week.  One of the most common issues patients have following surgery is constipation.  Even if you have a regular bowel pattern at home, your normal regimen is likely to be disrupted due to multiple reasons following surgery.  Combination of anesthesia, postoperative narcotics, change in appetite and fluid intake all can affect your bowels.  In order to avoid complications following surgery, here are some recommendations in order to help you during your recovery period.  Colace (docusate) - Pick up an over-the-counter form of Colace or another stool softener and take twice a day as long as you are requiring postoperative pain medications.  Take with a full glass of water daily.  If you experience loose stools or diarrhea, hold the colace until you stool forms back up.  If your symptoms do not get better within 1 week or if they get worse, check with your doctor.  Dulcolax (bisacodyl) - Pick up over-the-counter and take as directed by the product packaging as needed to assist with the movement of your bowels.  Take with a full glass of water.  Use this product as needed if not relieved by Colace only.   MiraLax (polyethylene glycol) - Pick up over-the-counter to have on hand.  MiraLax is a solution that will increase the amount of water in your bowels to assist with bowel movements.  Take as directed and can mix with a glass of water, juice, soda, coffee,  or tea.  Take if you go more than two days without a movement. Do not use MiraLax more than once per day. Call your doctor if you are still constipated or irregular after using this medication for 7 days in a row.  If you continue to have problems with postoperative constipation, please contact the office for further assistance and recommendations.  If you experience "the worst abdominal pain ever" or develop nausea or vomiting, please contact the office immediatly for further recommendations for treatment.   Take Xarelto for two and a half more weeks, then discontinue Xarelto. Once the patient has completed the Xarelto, they may resume the 81 mg Aspirin.   Do not put a pillow under the knee. Place it under the heel.    Complete by:  As directed    Do not sit on low chairs, stoools or toilet seats, as it may  be difficult to get up from low surfaces    Complete by:  As directed    Driving restrictions    Complete by:  As directed    No driving until released by the physician.   Increase activity slowly as tolerated    Complete by:  As directed    Lifting restrictions    Complete by:  As directed    No lifting until released by the physician.   Patient may shower    Complete by:  As directed    You may shower without a dressing once there is no drainage.  Do not wash over the wound.  If drainage remains, do not shower until drainage stops.   TED hose    Complete by:  As directed    Use stockings (TED hose) for 3 weeks on both leg(s).  You may remove them at night for sleeping.   Weight bearing as tolerated    Complete by:  As directed    Laterality:  left   Extremity:  Lower       Medication List    STOP taking these medications   aspirin EC 81 MG tablet   CALCIUM-MAGNESIUM PO   cholecalciferol 400 units Tabs tablet Commonly known as:  VITAMIN D   FISH OIL PO   multivitamin with minerals Tabs tablet   PREMARIN 0.45 MG tablet Generic drug:  estrogens (conjugated)       TAKE these medications   hydrocortisone 2.5 % cream Apply 1 application topically daily as needed (dermatitis).   ketoconazole 2 % cream Commonly known as:  NIZORAL Apply 1 application topically daily as needed (dermatitis).   Magnesium 250 MG Tabs Take 250 mg by mouth daily.   methocarbamol 500 MG tablet Commonly known as:  ROBAXIN Take 1 tablet (500 mg total) by mouth every 6 (six) hours as needed for muscle spasms.   oxyCODONE 5 MG immediate release tablet Commonly known as:  Oxy IR/ROXICODONE Take 1-2 tablets (5-10 mg total) by mouth every 3 (three) hours as needed for moderate pain or severe pain.   Potassium 99 MG Tabs Take 99 mg by mouth daily.   rivaroxaban 10 MG Tabs tablet Commonly known as:  XARELTO Take 1 tablet (10 mg total) by mouth daily with breakfast. Take Xarelto for two and a half more weeks, then discontinue Xarelto. Once the patient has completed the Xarelto, they may resume the 81 mg Aspirin.   terconazole 0.8 % vaginal cream Commonly known as:  TERAZOL 3 Place 1 applicator vaginally at bedtime. For 3 days   traMADol 50 MG tablet Commonly known as:  ULTRAM Take 1-2 tablets (50-100 mg total) by mouth every 6 (six) hours as needed for moderate pain.            Durable Medical Equipment        Start     Ordered   11/22/15 1322  For home use only DME Walker rolling  Once     11/22/15 1322   11/22/15 1322  For home use only DME 3 n 1  Once     11/22/15 1322     Follow-up Information    [facility] Follow up.   Specialty:  Home Health Services Why:  home health physical therapy Contact information: [address] [place] [address] [phone]        [facility] - [facility] Follow up.   Why:  rolling walker and 3n1 Contact  information: [place] [address] [phone]        Gearlean Alf, MD. Schedule an appointment as soon as possible for a visit on 12/06/2015.   Specialty:   Orthopedic Surgery Contact information: [address] [place] [address] [phone]           Signed: Arlee Muslim, PA-C Orthopaedic Surgery 11/23/2015, 7:45 AM   1

## 2015-11-23 NOTE — Progress Notes (Signed)
   Subjective: 2 Days Post-Op Procedure(s) (LRB): LEFT TOTAL KNEE ARTHROPLASTY (Left) Patient reports pain as mild.   Patient seen in rounds by Dr. Lequita HaltAluisio. Patient is well, but has had some minor complaints of pain in the knee, requiring pain medications Patient is ready to go home with family today.  Objective: Vital signs in last 24 hours: Temp:  [98 F (36.7 C)-99.1 F (37.3 C)] 98.8 F (37.1 C) (11/08 0541) Pulse Rate:  [51-73] 73 (11/08 0541) Resp:  [16] 16 (11/08 0541) BP: (107-149)/(53-84) 130/65 (11/08 0541) SpO2:  [97 %-100 %] 100 % (11/08 0541)  Intake/Output from previous day:  Intake/Output Summary (Last 24 hours) at 11/23/15 0740 Last data filed at 11/23/15 0538  Gross per 24 hour  Intake              840 ml  Output             1300 ml  Net             -460 ml    Intake/Output this shift: No intake/output data recorded.  Labs:  Recent Labs  11/22/15 0408 11/23/15 0412  HGB 10.6* 10.6*    Recent Labs  11/22/15 0408 11/23/15 0412  WBC 8.1 7.2  RBC 3.52* 3.49*  HCT 32.1* 32.2*  PLT 167 173    Recent Labs  11/22/15 0408 11/23/15 0412  NA 138 140  K 3.5 3.8  CL 106 107  CO2 27 26  BUN 7 7  CREATININE 0.55 0.49  GLUCOSE 101* 96  CALCIUM 8.2* 8.1*   No results for input(s): LABPT, INR in the last 72 hours.  EXAM: General - Patient is Alert, Appropriate and Oriented Extremity - Neurovascular intact Sensation intact distally Dorsiflexion/Plantar flexion intact Incision - clean, dry, no drainage Motor Function - intact, moving foot and toes well on exam.   Assessment/Plan: 2 Days Post-Op Procedure(s) (LRB): LEFT TOTAL KNEE ARTHROPLASTY (Left) Procedure(s) (LRB): LEFT TOTAL KNEE ARTHROPLASTY (Left) Past Medical History:  Diagnosis Date  . Arthritis   . GERD (gastroesophageal reflux disease)   . Irregular heart beat   . Yeast infection of the vagina    current on 11/14/2015 per patient    Principal Problem:   OA  (osteoarthritis) of knee  Estimated body mass index is 21.63 kg/m as calculated from the following:   Height as of this encounter: 5\' 5"  (1.651 m).   Weight as of this encounter: 59 kg (130 lb). Up with therapy Discharge home with home health Diet - Regular diet Follow up - in 2 weeks Activity - WBAT Disposition - Home Condition Upon Discharge - Good D/C Meds - See DC Summary DVT Prophylaxis - Xarelto  Avel Peacerew Miyoko Hashimi, PA-C Orthopaedic Surgery 11/23/2015, 7:40 AM

## 2019-02-13 ENCOUNTER — Ambulatory Visit: Payer: Medicare Other

## 2019-02-21 ENCOUNTER — Ambulatory Visit: Payer: Medicare Other | Attending: Internal Medicine

## 2019-02-21 DIAGNOSIS — Z23 Encounter for immunization: Secondary | ICD-10-CM | POA: Insufficient documentation

## 2019-02-21 NOTE — Progress Notes (Signed)
   Covid-19 Vaccination Clinic  Name:  MILLIANI HERRADA    MRN: [id number] DOB: [birth year]  02/21/2019  Ms. Timmins was observed post Covid-19 immunization for 15 minutes without incidence. She was provided with Vaccine Information Sheet and instruction to access the V-Safe system.   Ms. Delpriore was instructed to call 911 with any severe reactions post vaccine: Marland Kitchen Difficulty breathing  . Swelling of your face and throat  . A fast heartbeat  . A bad rash all over your body  . Dizziness and weakness    Immunizations Administered    Name Date Dose VIS Date Route   Pfizer COVID-19 Vaccine 02/21/2019  5:32 PM 0.3 mL 12/26/2018 Intramuscular   Manufacturer: [employer], [employer]   Lot: [id number]   NDC: [address]-0

## 2019-03-10 ENCOUNTER — Inpatient Hospital Stay (HOSPITAL_COMMUNITY)
Admission: EM | Admit: 2019-03-10 | Discharge: 2019-03-11 | DRG: 242 | Disposition: A | Discharge status: Home / Self Care | Payer: Medicare Other | Attending: Cardiology | Admitting: Cardiology

## 2019-03-10 ENCOUNTER — Other Ambulatory Visit: Payer: Self-pay

## 2019-03-10 ENCOUNTER — Emergency Department (HOSPITAL_COMMUNITY): Payer: Medicare Other

## 2019-03-10 ENCOUNTER — Encounter (HOSPITAL_COMMUNITY): Payer: Self-pay | Admitting: Emergency Medicine

## 2019-03-10 DIAGNOSIS — I442 Atrioventricular block, complete: Principal | ICD-10-CM | POA: Diagnosis present

## 2019-03-10 DIAGNOSIS — Z96652 Presence of left artificial knee joint: Secondary | ICD-10-CM | POA: Diagnosis present

## 2019-03-10 DIAGNOSIS — Z7901 Long term (current) use of anticoagulants: Secondary | ICD-10-CM

## 2019-03-10 DIAGNOSIS — R55 Syncope and collapse: Secondary | ICD-10-CM | POA: Diagnosis present

## 2019-03-10 DIAGNOSIS — R7401 Elevation of levels of liver transaminase levels: Secondary | ICD-10-CM | POA: Diagnosis present

## 2019-03-10 DIAGNOSIS — K219 Gastro-esophageal reflux disease without esophagitis: Secondary | ICD-10-CM | POA: Diagnosis present

## 2019-03-10 DIAGNOSIS — Z87891 Personal history of nicotine dependence: Secondary | ICD-10-CM

## 2019-03-10 DIAGNOSIS — R5383 Other fatigue: Secondary | ICD-10-CM | POA: Diagnosis not present

## 2019-03-10 DIAGNOSIS — Z950 Presence of cardiac pacemaker: Secondary | ICD-10-CM

## 2019-03-10 DIAGNOSIS — R001 Bradycardia, unspecified: Secondary | ICD-10-CM | POA: Diagnosis present

## 2019-03-10 DIAGNOSIS — M1990 Unspecified osteoarthritis, unspecified site: Secondary | ICD-10-CM | POA: Diagnosis present

## 2019-03-10 DIAGNOSIS — N179 Acute kidney failure, unspecified: Secondary | ICD-10-CM | POA: Diagnosis present

## 2019-03-10 DIAGNOSIS — E872 Acidosis: Secondary | ICD-10-CM | POA: Diagnosis present

## 2019-03-10 DIAGNOSIS — I213 ST elevation (STEMI) myocardial infarction of unspecified site: Secondary | ICD-10-CM | POA: Diagnosis present

## 2019-03-10 DIAGNOSIS — Z20822 Contact with and (suspected) exposure to covid-19: Secondary | ICD-10-CM | POA: Diagnosis present

## 2019-03-10 DIAGNOSIS — E860 Dehydration: Secondary | ICD-10-CM | POA: Diagnosis present

## 2019-03-10 HISTORY — DX: Atrioventricular block, complete: I44.2

## 2019-03-10 LAB — COMPREHENSIVE METABOLIC PANEL
ALT: 113 U/L — ABNORMAL HIGH (ref 0–44)
AST: 104 U/L — ABNORMAL HIGH (ref 15–41)
Albumin: 3.4 g/dL — ABNORMAL LOW (ref 3.5–5.0)
Alkaline Phosphatase: 60 U/L (ref 38–126)
Anion gap: 14 (ref 5–15)
BUN: 30 mg/dL — ABNORMAL HIGH (ref 8–23)
CO2: 18 mmol/L — ABNORMAL LOW (ref 22–32)
Calcium: 8.8 mg/dL — ABNORMAL LOW (ref 8.9–10.3)
Chloride: 100 mmol/L (ref 98–111)
Creatinine, Ser: 1.2 mg/dL — ABNORMAL HIGH (ref 0.44–1.00)
GFR calc Af Amer: 48 mL/min — ABNORMAL LOW (ref >60)
GFR calc non Af Amer: 41 mL/min — ABNORMAL LOW (ref >60)
Glucose, Bld: 158 mg/dL — ABNORMAL HIGH (ref 70–99)
Potassium: 4.2 mmol/L (ref 3.5–5.1)
Sodium: 132 mmol/L — ABNORMAL LOW (ref 135–145)
Total Bilirubin: 0.8 mg/dL (ref 0.3–1.2)
Total Protein: 6.4 g/dL — ABNORMAL LOW (ref 6.5–8.1)

## 2019-03-10 LAB — CBC WITH DIFFERENTIAL/PLATELET
Abs Immature Granulocytes: 0.05 10*3/uL (ref 0.00–0.07)
Basophils Absolute: 0 10*3/uL (ref 0.0–0.1)
Basophils Relative: 0 %
Eosinophils Absolute: 0 10*3/uL (ref 0.0–0.5)
Eosinophils Relative: 0 %
HCT: 44.1 % (ref 36.0–46.0)
Hemoglobin: 14.2 g/dL (ref 12.0–15.0)
Immature Granulocytes: 1 %
Lymphocytes Relative: 13 %
Lymphs Abs: 1.1 10*3/uL (ref 0.7–4.0)
MCH: 30.5 pg (ref 26.0–34.0)
MCHC: 32.2 g/dL (ref 30.0–36.0)
MCV: 94.8 fL (ref 80.0–100.0)
Monocytes Absolute: 0.3 10*3/uL (ref 0.1–1.0)
Monocytes Relative: 3 %
Neutro Abs: 7 10*3/uL (ref 1.7–7.7)
Neutrophils Relative %: 83 %
Platelets: 234 10*3/uL (ref 150–400)
RBC: 4.65 MIL/uL (ref 3.87–5.11)
RDW: 14.3 % (ref 11.5–15.5)
WBC: 8.4 10*3/uL (ref 4.0–10.5)
nRBC: 0 % (ref 0.0–0.2)

## 2019-03-10 LAB — MAGNESIUM: Magnesium: 1.9 mg/dL (ref 1.7–2.4)

## 2019-03-10 LAB — RESPIRATORY PANEL BY RT PCR (FLU A&B, COVID)
Influenza A by PCR: NEGATIVE
Influenza B by PCR: NEGATIVE
SARS Coronavirus 2 by RT PCR: NEGATIVE

## 2019-03-10 LAB — TSH: TSH: 5.249 u[IU]/mL — ABNORMAL HIGH (ref 0.350–4.500)

## 2019-03-10 MED ORDER — SODIUM CHLORIDE 0.9 % IV SOLN: INTRAVENOUS | Status: DC

## 2019-03-10 MED ORDER — INSULIN ASPART 100 UNIT/ML ~~LOC~~ SOLN: 0.0000 [IU] | Freq: Three times a day (TID) | SUBCUTANEOUS | Status: DC

## 2019-03-10 NOTE — ED Notes (Signed)
Report attempted again, was told floor nurse was giving medication and would return call. Will give bedside report if call not returned by 2300

## 2019-03-10 NOTE — ED Notes (Signed)
This RN attempted to call report on pt. Was told floor RN was busy, gave number to return call.

## 2019-03-10 NOTE — H&P (Addendum)
Lindsay Frazier is an 86 y.o. female.   Chief Complaint: Fatigue, dyspnea and low heart rate HPI: Lindsay Frazier is a pleasant fairly active, independent Caucasian female patient with no significant medical history, not on any medications except  Estrogen replacement, Started noticing marked fatigue, shortness of breath and dizziness earlier this morning and checked her blood pressure, found out that her heart rate has been 30 bpm all day.  This evening her son Dr. Dominica Severin visited her and confirmed that her heart rate had been low and I advised him to come to the emergency room.  No chest pain, no shortness of breath, no leg edema, no PND or orthopnea.  She received Covid vaccine about 2.5 weeks ago and states that since then had noticed occasional episodes of skipping heartbeats.  Past Medical History:  Diagnosis Date  . Arthritis   . GERD (gastroesophageal reflux disease)   . Irregular heart beat   . Yeast infection of the vagina    current on 11/14/2015 per patient     Past Surgical History:  Procedure Laterality Date  . TOTAL KNEE ARTHROPLASTY Left 11/21/2015   Procedure: LEFT TOTAL KNEE ARTHROPLASTY;  Surgeon: Ollen Gross, MD;  Location: [facility];  Service: Orthopedics;  Laterality: Left;    No family history on file. Social History:  reports that she has quit smoking. She quit after 5.00 years of use. She has never used smokeless tobacco. She reports current alcohol use. She reports that she does not use drugs.  Allergies: No Known Allergies  Results for orders placed or performed during the hospital encounter of 03/10/19 (from the past 48 hour(s))  CBC with Differential     Status: None   Collection Time: 03/10/19  8:45 PM  Result Value Ref Range   WBC 8.4 4.0 - 10.5 K/uL   RBC 4.65 3.87 - 5.11 MIL/uL   Hemoglobin 14.2 12.0 - 15.0 g/dL   HCT 81.1 91.4 - 78.2 %   MCV 94.8 80.0 - 100.0 fL   MCH 30.5 26.0 - 34.0 pg   MCHC 32.2 30.0 - 36.0 g/dL   RDW 95.6 21.3 - 08.6  %   Platelets 234 150 - 400 K/uL   nRBC 0.0 0.0 - 0.2 %   Neutrophils Relative % 83 %   Neutro Abs 7.0 1.7 - 7.7 K/uL   Lymphocytes Relative 13 %   Lymphs Abs 1.1 0.7 - 4.0 K/uL   Monocytes Relative 3 %   Monocytes Absolute 0.3 0.1 - 1.0 K/uL   Eosinophils Relative 0 %   Eosinophils Absolute 0.0 0.0 - 0.5 K/uL   Basophils Relative 0 %   Basophils Absolute 0.0 0.0 - 0.1 K/uL   Immature Granulocytes 1 %   Abs Immature Granulocytes 0.05 0.00 - 0.07 K/uL    Comment: Performed at [facility], [address], [place], [place] [address]   CMP     Component Value Date/Time   NA 132 (L) 03/10/2019 2045   K 4.2 03/10/2019 2045   CL 100 03/10/2019 2045   CO2 18 (L) 03/10/2019 2045   GLUCOSE 158 (H) 03/10/2019 2045   BUN 30 (H) 03/10/2019 2045   CREATININE 1.20 (H) 03/10/2019 2045   CALCIUM 8.8 (L) 03/10/2019 2045   PROT 6.4 (L) 03/10/2019 2045   ALBUMIN 3.4 (L) 03/10/2019 2045   AST 104 (H) 03/10/2019 2045   ALT 113 (H) 03/10/2019 2045   ALKPHOS 60 03/10/2019 2045   BILITOT 0.8 03/10/2019 2045  GFRNONAA 41 (L) 03/10/2019 2045   GFRAA 48 (L) 03/10/2019 2045     Ref Range & Units 20:46  TSH 03/10/19  0.350 - 4.500 uIU/mL 5.249High       DG Chest Portable 1 View  Result Date: 03/10/2019 CLINICAL DATA:  Bradycardia and weakness EXAM: PORTABLE CHEST 1 VIEW COMPARISON:  None. FINDINGS: The heart size and mediastinal contours are within normal limits. There is hyperinflation of the with flattening of the hemidiaphragms. Aortic knob calcifications. The visualized skeletal structures are unremarkable. IMPRESSION: No active disease. Electronically Signed   By: Prudencio Pair M.D.   On: 03/10/2019 21:04   No outpatient medications have been marked as taking for the 03/10/19 encounter [facility] Encounter).    Review of Systems  Constitutional: Positive for fatigue. Negative for unexpected weight change.  HENT: Negative for congestion.   Eyes: Negative for visual disturbance.   Respiratory: Positive for shortness of breath.   Gastrointestinal: Positive for abdominal pain. Negative for nausea and vomiting.  Endocrine: Negative for cold intolerance.  Genitourinary: Negative for dysuria.  Musculoskeletal: Negative for myalgias.  Skin: Negative for rash.  Allergic/Immunologic: Negative for immunocompromised state.  Neurological: Positive for dizziness and weakness.  Hematological: Does not bruise/bleed easily.  Psychiatric/Behavioral: The patient is not nervous/anxious.   All other systems reviewed and are negative.  Blood pressure (!) 131/50, pulse (!) 36, temperature 98.6 F (37 C), temperature source Oral, resp. rate 16, height 5\' 5"  (1.651 m), weight 59 kg, SpO2 100 %. Physical Exam  Constitutional: She is oriented to person, place, and time. No distress.  Eyes: Conjunctivae are normal.  Neck: No JVD present. No thyromegaly present.  Cardiovascular: Normal rate, normal heart sounds and intact distal pulses. Exam reveals no gallop.  No murmur heard. Respiratory: Effort normal and breath sounds normal.  GI: Soft. Bowel sounds are normal.  Musculoskeletal:        General: No edema. Normal range of motion.  Neurological: She is alert and oriented to person, place, and time.  Skin: Skin is warm and dry.    EKG 03/10/2019: Underlying sinus rhythm with complete heart block, with junctional escape rhythm at rate of 30 bpm, rare PACs.  Assessment/Plan 1.  Complete heart block 2.  Shortness of breath, dizziness and fatigue related to complete heart block. 3. Acute renal failure stage 3 probably related to dehydration from acute illness 4. Abnormal S. Glucose 5. Abnormal LFT.  6. Metabolic acidosis.   Recommendation: I reviewed the chest x-ray, labs,  she is not on any negative chronotropic she needs permanent pacemaker implantation.  Dr. Thompson Grayer is aware of her admission and he plans to see her in the morning.  Will obtain an echocardiogram.  Admitted to  stepdown unit for close monitoring.  Presently has stable escape rhythm, no indication for temporary pacemaker implantation.  All questions answered.  She is willing to proceed with pacemaker implantation.  I will hydrate her and f/u on her labs in the morning with regards to LFT and renal function, check A1c. TSH minimally abnormal and not enough to explain complete heart block. She is in mild metabolic acidosis.   Adrian Prows, MD 03/10/2019, 9:35 PM

## 2019-03-10 NOTE — ED Provider Notes (Signed)
Emergency Department Provider Note   I have reviewed the triage vital signs and the nursing notes.   HISTORY  Chief Complaint Bradycardia and Dizziness   HPI Lindsay Frazier is a 86 y.o. female presents to the ED with generalized weakness and low heart rate from home.  Patient developed generalized weakness and lightheadedness earlier this morning.  She did not experience any chest pain.  She alerted her son, Dr. Amedeo Plenty, who came to check on her and confirmed her heart rate to be in the 30s.  Dr. Einar Gip met the patient at the bedside in the emergency department upon arrival.  No active chest pain or palpitations in the emergency department.  No radiation of symptoms or modifying factors.  Past Medical History:  Diagnosis Date  . Arthritis   . GERD (gastroesophageal reflux disease)   . Irregular heart beat   . Yeast infection of the vagina    current on 11/14/2015 per patient     Patient Active Problem List   Diagnosis Date Noted  . Complete heart block ([place]) 03/10/2019  . OA (osteoarthritis) of knee 11/21/2015    Past Surgical History:  Procedure Laterality Date  . TOTAL KNEE ARTHROPLASTY Left 11/21/2015   Procedure: LEFT TOTAL KNEE ARTHROPLASTY;  Surgeon: Gaynelle Arabian, MD;  Location: [facility];  Service: Orthopedics;  Laterality: Left;    Allergies Patient has no known allergies.  No family history on file.  Social History Social History   Tobacco Use  . Smoking status: Former Smoker    Years: 5.00  . Smokeless tobacco: Never Used  Substance Use Topics  . Alcohol use: Yes    Comment: glass of wine daily   . Drug use: No    Review of Systems  Constitutional: No fever/chills. Positive generalized weakness.  Eyes: No visual changes. ENT: No sore throat. Cardiovascular: Denies chest pain. Low HR.  Respiratory: Denies shortness of breath. Gastrointestinal: No abdominal pain.    10-point ROS otherwise  negative.  ____________________________________________   PHYSICAL EXAM:  VITAL SIGNS: Vitals:   03/10/19 2306 03/10/19 2320  BP: (!) 122/93 113/84  Pulse:    Resp: 10 11  Temp:    SpO2:     Constitutional: Well appearing and in no acute distress. Eyes: Conjunctivae are normal. Head: Atraumatic. Nose: No congestion/rhinnorhea. Mouth/Throat: Mucous membranes are moist.   Neck: No stridor.  Cardiovascular: Bradycardia with complete heart block on the monitor. Well perfused.  Respiratory: Normal respiratory effort.  Gastrointestinal: No distention.  Musculoskeletal: No gross deformities of extremities. Neurologic:  Normal speech and language.  Skin:  Skin is warm, dry and intact. No rash noted.  ____________________________________________   LABS (all labs ordered are listed, but only abnormal results are displayed)  Labs Reviewed  COMPREHENSIVE METABOLIC PANEL - Abnormal; Notable for the following components:      Result Value   Sodium 132 (*)    CO2 18 (*)    Glucose, Bld 158 (*)    BUN 30 (*)    Creatinine, Ser 1.20 (*)    Calcium 8.8 (*)    Total Protein 6.4 (*)    Albumin 3.4 (*)    AST 104 (*)    ALT 113 (*)    GFR calc non Af Amer 41 (*)    GFR calc Af Amer 48 (*)    All other components within normal limits  TSH - Abnormal; Notable for the following components:   TSH 5.249 (*)    All other components  within normal limits  RESPIRATORY PANEL BY RT PCR (FLU A&B, COVID)  CBC WITH DIFFERENTIAL/PLATELET  MAGNESIUM  COMPREHENSIVE METABOLIC PANEL  HEMOGLOBIN A1C   ____________________________________________  EKG   EKG Interpretation  Date/Time:  Tuesday March 10 2019 20:43:32 EST Ventricular Rate:  46 PR Interval:    QRS Duration: 139 QT Interval:  561 QTC Calculation: 491 R Axis:   65 Text Interpretation: Complete (3-degree) AV block No STEMI Confirmed by Nanda Quinton [phone] on 03/10/2019 11:37:38 PM        ____________________________________________  RADIOLOGY  DG Chest Portable 1 View  Result Date: 03/10/2019 CLINICAL DATA:  Bradycardia and weakness EXAM: PORTABLE CHEST 1 VIEW COMPARISON:  None. FINDINGS: The heart size and mediastinal contours are within normal limits. There is hyperinflation of the with flattening of the hemidiaphragms. Aortic knob calcifications. The visualized skeletal structures are unremarkable. IMPRESSION: No active disease. Electronically Signed   By: Prudencio Pair M.D.   On: 03/10/2019 21:04    ____________________________________________   PROCEDURES  Procedure(s) performed:   Procedures  CRITICAL CARE Performed by: Margette Fast Total critical care time: 35 minutes Critical care time was exclusive of separately billable procedures and treating other patients. Critical care was necessary to treat or prevent imminent or life-threatening deterioration. Critical care was time spent personally by me on the following activities: development of treatment plan with patient and/or surrogate as well as nursing, discussions with consultants, evaluation of patient's response to treatment, examination of patient, obtaining history from patient or surrogate, ordering and performing treatments and interventions, ordering and review of laboratory studies, ordering and review of radiographic studies, pulse oximetry and re-evaluation of patient's condition.  Nanda Quinton, MD Emergency Medicine  ____________________________________________   INITIAL IMPRESSION / ASSESSMENT AND PLAN / ED COURSE  Pertinent labs & imaging results that were available during my care of the patient were reviewed by me and considered in my medical decision making (see chart for details).   Patient presents emergency department with symptomatic bradycardia.  Patient met in the emergency department by Dr. Einar Gip who is at bedside and confirmed diagnosis on monitor.  Patient's blood pressure is  normal along with mental status.  Appears well perfused.  I have ordered initial labs in conjunction with the cardiology team who plan to admit for pacemaker in the morning.  No vital sign abnormality or mental status change to require emergent, temporary pacemaker placement.   Discussed patient's case with Cardiology to request admission. Patient and family (if present) updated with plan. Care transferred to Cardiology service.  I reviewed all nursing notes, vitals, pertinent old records, EKGs, labs, imaging (as available).  ____________________________________________  FINAL CLINICAL IMPRESSION(S) / ED DIAGNOSES  Final diagnoses:  Complete heart block ([place])    MEDICATIONS GIVEN DURING THIS VISIT:  Medications  0.9 %  sodium chloride infusion ( Intravenous New Bag/Given 03/10/19 2243)  insulin aspart (novoLOG) injection 0-9 Units (has no administration in time range)    Note:  This document was prepared using Dragon voice recognition software and may include unintentional dictation errors.  Nanda Quinton, MD, [facility]    Jackalyn Haith, [facility], MD 03/10/19 [id number]

## 2019-03-10 NOTE — ED Notes (Signed)
Pt's son, MD Hypes, at bedside speaking to MD [facility] about pt plan of care.

## 2019-03-10 NOTE — ED Notes (Signed)
AC contacted with regard to pt placement. Rapid Response RN to see pt & assess level of care

## 2019-03-10 NOTE — ED Triage Notes (Signed)
Pt c/o dizziness, shortness of breath walking short distances and generalized fatigue. Reports symptoms started this morning at 0700. Denies nausea or chest pain. States she had one episode of vomiting a bile like substance at 1800 tonight.

## 2019-03-10 NOTE — ED Notes (Signed)
This RN was made aware of pt's P (82/49 MAP of 61). Pt currently speaking to son at bedside, mentating appropriately. Low BP likely due to elevated cuff, will continue to monitor.

## 2019-03-10 NOTE — ED Notes (Signed)
Pt vomited around 75mL bile due to "not having eaten" & potential underlying gastritis, requested saltines to help settle the stomach

## 2019-03-11 ENCOUNTER — Observation Stay (HOSPITAL_COMMUNITY): Payer: Medicare Other

## 2019-03-11 ENCOUNTER — Inpatient Hospital Stay (HOSPITAL_COMMUNITY): Payer: Medicare Other

## 2019-03-11 ENCOUNTER — Inpatient Hospital Stay (HOSPITAL_COMMUNITY)
Admission: EM | Disposition: A | Discharge status: Home / Self Care | Payer: Self-pay | Source: Home / Self Care | Attending: Cardiology

## 2019-03-11 ENCOUNTER — Encounter: Payer: Self-pay | Admitting: Cardiology

## 2019-03-11 DIAGNOSIS — R55 Syncope and collapse: Secondary | ICD-10-CM | POA: Diagnosis present

## 2019-03-11 DIAGNOSIS — Z87891 Personal history of nicotine dependence: Secondary | ICD-10-CM | POA: Diagnosis not present

## 2019-03-11 DIAGNOSIS — R5383 Other fatigue: Secondary | ICD-10-CM | POA: Diagnosis present

## 2019-03-11 DIAGNOSIS — E872 Acidosis: Secondary | ICD-10-CM | POA: Diagnosis present

## 2019-03-11 DIAGNOSIS — K219 Gastro-esophageal reflux disease without esophagitis: Secondary | ICD-10-CM | POA: Diagnosis present

## 2019-03-11 DIAGNOSIS — M1990 Unspecified osteoarthritis, unspecified site: Secondary | ICD-10-CM | POA: Diagnosis present

## 2019-03-11 DIAGNOSIS — Z45018 Encounter for adjustment and management of other part of cardiac pacemaker: Secondary | ICD-10-CM

## 2019-03-11 DIAGNOSIS — Z950 Presence of cardiac pacemaker: Secondary | ICD-10-CM

## 2019-03-11 DIAGNOSIS — Z20822 Contact with and (suspected) exposure to covid-19: Secondary | ICD-10-CM | POA: Diagnosis present

## 2019-03-11 DIAGNOSIS — I213 ST elevation (STEMI) myocardial infarction of unspecified site: Secondary | ICD-10-CM | POA: Diagnosis present

## 2019-03-11 DIAGNOSIS — R001 Bradycardia, unspecified: Secondary | ICD-10-CM | POA: Diagnosis present

## 2019-03-11 DIAGNOSIS — Z96652 Presence of left artificial knee joint: Secondary | ICD-10-CM | POA: Diagnosis present

## 2019-03-11 DIAGNOSIS — I442 Atrioventricular block, complete: Secondary | ICD-10-CM | POA: Diagnosis present

## 2019-03-11 DIAGNOSIS — R7401 Elevation of levels of liver transaminase levels: Secondary | ICD-10-CM | POA: Diagnosis present

## 2019-03-11 DIAGNOSIS — N179 Acute kidney failure, unspecified: Secondary | ICD-10-CM | POA: Diagnosis present

## 2019-03-11 DIAGNOSIS — E860 Dehydration: Secondary | ICD-10-CM | POA: Diagnosis present

## 2019-03-11 DIAGNOSIS — Z7901 Long term (current) use of anticoagulants: Secondary | ICD-10-CM | POA: Diagnosis not present

## 2019-03-11 HISTORY — PX: PACEMAKER IMPLANT: EP1218

## 2019-03-11 HISTORY — DX: Encounter for adjustment and management of other part of cardiac pacemaker: Z45.018

## 2019-03-11 HISTORY — DX: Presence of cardiac pacemaker: Z95.0

## 2019-03-11 LAB — COMPREHENSIVE METABOLIC PANEL
ALT: 91 U/L — ABNORMAL HIGH (ref 0–44)
AST: 73 U/L — ABNORMAL HIGH (ref 15–41)
Albumin: 2.7 g/dL — ABNORMAL LOW (ref 3.5–5.0)
Alkaline Phosphatase: 50 U/L (ref 38–126)
Anion gap: 11 (ref 5–15)
BUN: 30 mg/dL — ABNORMAL HIGH (ref 8–23)
CO2: 20 mmol/L — ABNORMAL LOW (ref 22–32)
Calcium: 8.3 mg/dL — ABNORMAL LOW (ref 8.9–10.3)
Chloride: 105 mmol/L (ref 98–111)
Creatinine, Ser: 1.01 mg/dL — ABNORMAL HIGH (ref 0.44–1.00)
GFR calc Af Amer: 59 mL/min — ABNORMAL LOW (ref >60)
GFR calc non Af Amer: 51 mL/min — ABNORMAL LOW (ref >60)
Glucose, Bld: 118 mg/dL — ABNORMAL HIGH (ref 70–99)
Potassium: 3.7 mmol/L (ref 3.5–5.1)
Sodium: 136 mmol/L (ref 135–145)
Total Bilirubin: 0.7 mg/dL (ref 0.3–1.2)
Total Protein: 5.3 g/dL — ABNORMAL LOW (ref 6.5–8.1)

## 2019-03-11 LAB — ECHOCARDIOGRAM COMPLETE
Height: 65 in
Weight: 2027.2 oz

## 2019-03-11 LAB — HEMOGLOBIN A1C
Hgb A1c MFr Bld: 5.7 % — ABNORMAL HIGH (ref 4.8–5.6)
Mean Plasma Glucose: 116.89 mg/dL

## 2019-03-11 LAB — SURGICAL PCR SCREEN
MRSA, PCR: NEGATIVE
Staphylococcus aureus: POSITIVE — AB

## 2019-03-11 LAB — GLUCOSE, CAPILLARY
Glucose-Capillary: 86 mg/dL (ref 70–99)
Glucose-Capillary: 84 mg/dL (ref 70–99)
Glucose-Capillary: 93 mg/dL (ref 70–99)

## 2019-03-11 SURGERY — PACEMAKER IMPLANT

## 2019-03-11 MED ORDER — MIDAZOLAM HCL 5 MG/5ML IJ SOLN
INTRAMUSCULAR | Status: AC
  Filled 2019-03-11: qty 5

## 2019-03-11 MED ORDER — SODIUM CHLORIDE 0.9 % IV SOLN
INTRAVENOUS | Status: AC
  Filled 2019-03-11: qty 2

## 2019-03-11 MED ORDER — HEPARIN (PORCINE) IN NACL 1000-0.9 UT/500ML-% IV SOLN
INTRAVENOUS | Status: AC
  Filled 2019-03-11: qty 500

## 2019-03-11 MED ORDER — HEPARIN (PORCINE) IN NACL 1000-0.9 UT/500ML-% IV SOLN
INTRAVENOUS | Status: DC | PRN
  Administered 2019-03-11: 500 mL

## 2019-03-11 MED ORDER — ACETAMINOPHEN 325 MG PO TABS: 325.0000 mg | ORAL_TABLET | ORAL | Status: DC | PRN

## 2019-03-11 MED ORDER — LIDOCAINE HCL 1 % IJ SOLN
INTRAMUSCULAR | Status: AC
  Filled 2019-03-11: qty 20

## 2019-03-11 MED ORDER — LIDOCAINE HCL 1 % IJ SOLN
INTRAMUSCULAR | Status: AC
  Filled 2019-03-11: qty 60

## 2019-03-11 MED ORDER — CEFAZOLIN SODIUM-DEXTROSE 2-4 GM/100ML-% IV SOLN
2.0000 g | INTRAVENOUS | Status: AC
  Administered 2019-03-11: 2 g via INTRAVENOUS

## 2019-03-11 MED ORDER — CHLORHEXIDINE GLUCONATE 4 % EX LIQD: 60.0000 mL | Freq: Once | CUTANEOUS | Status: DC

## 2019-03-11 MED ORDER — ONDANSETRON HCL 4 MG/2ML IJ SOLN: 4.0000 mg | Freq: Four times a day (QID) | INTRAMUSCULAR | Status: DC | PRN

## 2019-03-11 MED ORDER — CEFAZOLIN SODIUM-DEXTROSE 2-4 GM/100ML-% IV SOLN
INTRAVENOUS | Status: AC
  Filled 2019-03-11: qty 100

## 2019-03-11 MED ORDER — SODIUM CHLORIDE 0.9% FLUSH: 3.0000 mL | Freq: Two times a day (BID) | INTRAVENOUS | Status: DC

## 2019-03-11 MED ORDER — SODIUM CHLORIDE 0.9 % IV SOLN: INTRAVENOUS | Status: DC

## 2019-03-11 MED ORDER — SODIUM CHLORIDE 0.9 % IV SOLN: 250.0000 mL | INTRAVENOUS | Status: DC | PRN

## 2019-03-11 MED ORDER — IOHEXOL 350 MG/ML SOLN
INTRAVENOUS | Status: DC | PRN
  Administered 2019-03-11: 15 mL via INTRAVENOUS

## 2019-03-11 MED ORDER — SODIUM CHLORIDE 0.9 % IV SOLN
80.0000 mg | INTRAVENOUS | Status: AC
  Administered 2019-03-11: 12:00:00 80 mg
  Filled 2019-03-11: qty 2

## 2019-03-11 MED ORDER — LIDOCAINE HCL (PF) 1 % IJ SOLN
INTRAMUSCULAR | Status: DC | PRN
  Administered 2019-03-11: 55 mL
  Administered 2019-03-11: 20 mL

## 2019-03-11 MED ORDER — SODIUM CHLORIDE 0.9% FLUSH: 3.0000 mL | INTRAVENOUS | Status: DC | PRN

## 2019-03-11 MED ORDER — MIDAZOLAM HCL 5 MG/5ML IJ SOLN
INTRAMUSCULAR | Status: DC | PRN
  Administered 2019-03-11: 1 mg via INTRAVENOUS

## 2019-03-11 SURGICAL SUPPLY — 13 items
CABLE SURGICAL S-101-97-12 (CABLE) ×3 IMPLANT
CATH RIGHTSITE C315HIS02 (CATHETERS) ×2 IMPLANT
IPG PACE AZUR XT DR MRI W1DR01 (Pacemaker) IMPLANT
KIT MICROPUNCTURE NIT STIFF (SHEATH) ×2 IMPLANT
LEAD CAPSURE NOVUS 45CM (Lead) ×2 IMPLANT
LEAD SELECT SECURE 3830 383069 (Lead) IMPLANT
PACE AZURE XT DR MRI W1DR01 (Pacemaker) ×3 IMPLANT
PAD PRO RADIOLUCENT 2001M-C (PAD) ×3 IMPLANT
SELECT SECURE 3830 383069 (Lead) ×3 IMPLANT
SHEATH 7FR PRELUDE SNAP 13 (SHEATH) ×4 IMPLANT
SLITTER 6232ADJ (MISCELLANEOUS) ×2 IMPLANT
TRAY PACEMAKER INSERTION (PACKS) ×3 IMPLANT
WIRE HI TORQ VERSACORE-J 145CM (WIRE) ×2 IMPLANT

## 2019-03-11 NOTE — Discharge Instructions (Signed)
After Your Pacemaker  . Do not lift your arm above shoulder height for 1 week after your procedure. After 7 days, you may progress as below.            03/18/2019                      03/18/2019                We can discuss these last 2 steps at follow-up visit.  . You have a Medtronic Pacemaker  . Do not lift, push, pull, or carry anything over 10 pounds with the affected arm until 6 weeks (Wednesday April 22, 2019) after your procedure.   . Do not drive until your wound check, or until instructed by your healthcare provider that you are safe to do so.   . Monitor your pacemaker site for redness, swelling, and drainage. Call the device clinic at [phone] if you experience these symptoms or fever/chills.  . If your incision is sealed with Steri-strips, you may shower 10 days after your procedure. Do not remove the steri-strips or let the shower hit directly on your site. You may wash around your site with soap and water.Avoid lotions, ointments, or perfumes over your incision until it is well-healed.  . You may use a hot tub or a pool AFTER your wound check appointment if the incision is completely closed.  . Your Pacemaker may be MRI compatible. We will discuss this at your first follow up/wound check. .   . Remote monitoring is used to monitor your pacemaker from home. This monitoring is scheduled every 91 days by our office. It allows [place] to keep an eye on the functioning of your device to ensure it is working properly. You will routinely see your Electrophysiologist annually (more often if necessary).    Pacemaker Implantation, Care After This sheet gives you information about how to care for yourself after your procedure. Your health care provider may also give you more specific instructions. If you have problems or questions, contact your health care provider. What can I expect after the procedure? After the procedure, it is common to have:  Mild pain.  Slight bruising.  Some  swelling over the incision.  A slight bump over the skin where the device was placed. Sometimes, it is possible to feel the device under the skin. This is normal.  You should received your Pacemaker ID card within 4-8 weeks. Follow these instructions at home: Medicines  Take over-the-counter medicines only as told by your health care provider. Wound care     Remove the LARGE bandage on your chest 03/12/2019.  After your bandage is removed, you may see pieces of tape called skin adhesive strips over the area where the cut was made (incision site). Let them fall off on their own.  Check the incision site every day to make sure it is not infected, bleeding, or starting to pull apart.  Do not use lotions or ointments near the incision site unless directed to do so.  Keep the incision area clean and dry for 10 days after the procedure or as directed by your health care provider. It takes several weeks for the incision site to completely heal.  Do not take baths, swim, or use a hot tub until after your wound check.  Activity  Do not drive or use heavy machinery until your follow up.   Do not drive for 24 hours if you were given a  medicine to help you relax (sedative).  Avoid sudden jerking, pulling, or chopping movements that pull your upper arm far away from your body. Avoid these movements for at least 6 weeks or as long as told by your health care provider.  Do not lift your upper arm above your shoulders for at least 6 weeks or as long as told by your health care provider. This means no tennis, golf, or swimming.  You may go back to work when your health care provider says it is okay. Pacemaker care  You may be shown how to transfer data from your pacemaker through the phone to your health care provider.  Always let all health care providers know about your pacemaker before you have any medical procedures or tests.  Wear a medical ID bracelet or necklace stating that you have a  pacemaker. Carry a pacemaker ID card with you at all times.  Your pacemaker battery will last for 5-15 years. Routine checks by your health care provider will let the health care provider know when the battery is starting to run down. The pacemaker will need to be replaced when the battery starts to run down.  Do not use amateur Chief of Staff. Other electrical devices are safe to use, including power tools, lawn mowers, and speakers. If you are unsure of whether something is safe to use, ask your health care provider.  When using your cell phone, hold it to the ear opposite the pacemaker. Do not leave your cell phone in a pocket over the pacemaker.  Avoid places or objects that have a strong electric or magnetic field, including: ? Airport Herbalist. When at the airport, let officials know that you have a pacemaker. ? Power plants. ? Large electrical generators. ? Radiofrequency transmission towers, such as cell phone and radio towers. General instructions  Weigh yourself every day. If you suddenly gain weight, fluid may be building up in your body.  Keep all follow-up visits as told by your health care provider. This is important. Contact a health care provider if:  You gain weight suddenly.  Your legs or feet swell.  It feels like your heart is fluttering or skipping beats (heart palpitations).  You have chills or a fever.  You have more redness, swelling, or pain around your incisions.  You have more fluid or blood coming from your incisions.  Your incisions feel warm to the touch.  You have pus or a bad smell coming from your incisions. Get help right away if:  You have chest pain.  You have trouble breathing or are short of breath.  You become extremely tired.  You are light-headed or you faint. This information is not intended to replace advice given to you by your health care provider. Make sure you discuss any questions you have  with your health care provider.

## 2019-03-11 NOTE — Interval H&P Note (Signed)
History and Physical Interval Note:  03/11/2019 10:54 AM  Lindsay Frazier  has presented today for surgery, with the diagnosis of heart block.  The various methods of treatment have been discussed with the patient and family. After consideration of risks, benefits and other options for treatment, the patient has consented to  Procedure(s): PACEMAKER IMPLANT (N/A) as a surgical intervention.  The patient's history has been reviewed, patient examined, no change in status, stable for surgery.  I have reviewed the patient's chart and labs.  Questions were answered to the patient's satisfaction.     Hillis Range

## 2019-03-11 NOTE — Progress Notes (Signed)
Pt had 2.7 sec pause- pt is asymptomatic- Notified Dr. Herbie Baltimore with this information

## 2019-03-11 NOTE — Consult Note (Addendum)
 ELECTROPHYSIOLOGY CONSULT NOTE    Patient ID: Lindsay Frazier MRN: [id number], DOB/AGE: 09/30/1933 86 y.o.  Admit date: 03/10/2019 Date of Consult: 03/11/2019  Primary Physician: Avva, Ravisankar, MD Primary Cardiologist: No primary care provider on file.  Electrophysiologist: New   Referring Provider: Dr. Ganji  Patient Profile: Lindsay Frazier is a 86 y.o. female with a history of arthritis and GERD who is being seen today for the evaluation of CHB at the request of Dr. Ganji.  HPI:  Lindsay Frazier is a 86 y.o. female with medical history above. She was her USOH until yesterday morning when she noticed marked fatigue, SOB, and dizziness. She checked her BP and her HR was in the 30s. Her son, Dr. William Stegman visited her and confirmed her HR and advised ER.  She denies chest pain, SOB, peripheral edema, PND, or orthopnea.  She is not on any AV nodal blocking agents.   EKG on admission showed complete heart block with junctional escape at 46 bpm with 1? PVC  Usually, she can do all of her house and yard work without difficulty. Yesterday, she was SOB walking from her bedroom to the kitchen with lightheadedness. No frank syncope, but + near syncope. Denies CP or any history of ischemic symptoms. She has problems with reflux, and is worried her empty stomach will exacerbate this.   Past Medical History:  Diagnosis Date  . Arthritis   . GERD (gastroesophageal reflux disease)   . Irregular heart beat   . Yeast infection of the vagina    current on 11/14/2015 per patient      Surgical History:  Past Surgical History:  Procedure Laterality Date  . TOTAL KNEE ARTHROPLASTY Left 11/21/2015   Procedure: LEFT TOTAL KNEE ARTHROPLASTY;  Surgeon: Frank Aluisio, MD;  Location: [facility];  Service: Orthopedics;  Laterality: Left;     Medications Prior to Admission  Medication Sig Dispense Refill Last Dose  . hydrocortisone 2.5 % cream Apply 1 application topically daily as needed  (dermatitis).      . ketoconazole (NIZORAL) 2 % cream Apply 1 application topically daily as needed (dermatitis).     . Magnesium 250 MG TABS Take 250 mg by mouth daily.     . methocarbamol (ROBAXIN) 500 MG tablet Take 1 tablet (500 mg total) by mouth every 6 (six) hours as needed for muscle spasms. 80 tablet 0   . norethindrone (AYGESTIN) 5 MG tablet Take 5 mg by mouth See admin instructions. Take 5 mg by mouth once daily for 15 days each month     . oxyCODONE (OXY IR/ROXICODONE) 5 MG immediate release tablet Take 1-2 tablets (5-10 mg total) by mouth every 3 (three) hours as needed for moderate pain or severe pain. 80 tablet 0   . Potassium 99 MG TABS Take 99 mg by mouth daily.     . PREMARIN 0.45 MG tablet Take 0.45 mg by mouth daily.     . rivaroxaban (XARELTO) 10 MG TABS tablet Take 1 tablet (10 mg total) by mouth daily with breakfast. Take Xarelto for two and a half more weeks, then discontinue Xarelto. Once the patient has completed the Xarelto, they may resume the 81 mg Aspirin. 19 tablet 0   . terconazole (TERAZOL 3) 0.8 % vaginal cream Place 1 applicator vaginally at bedtime. For 3 days     . traMADol (ULTRAM) 50 MG tablet Take 1-2 tablets (50-100 mg total) by mouth every 6 (six) hours as needed for moderate pain.   80 tablet 1     Inpatient Medications:  . insulin aspart  0-9 Units Subcutaneous TID WC    Allergies: No Known Allergies  Social History   Socioeconomic History  . Marital status: Divorced    Spouse name: Not on file  . Number of children: Not on file  . Years of education: Not on file  . Highest education level: Not on file  Occupational History  . Not on file  Tobacco Use  . Smoking status: Former Smoker    Years: 5.00  . Smokeless tobacco: Never Used  Substance and Sexual Activity  . Alcohol use: Yes    Comment: glass of wine daily   . Drug use: No  . Sexual activity: Not on file  Other Topics Concern  . Not on file  Social History Narrative  . Not on  file   Social Determinants of Health   Financial Resource Strain:   . Difficulty of Paying Living Expenses: Not on file  Food Insecurity:   . Worried About Programme researcher, broadcasting/film/video in the Last Year: Not on file  . Ran Out of Food in the Last Year: Not on file  Transportation Needs:   . Lack of Transportation (Medical): Not on file  . Lack of Transportation (Non-Medical): Not on file  Physical Activity:   . Days of Exercise per Week: Not on file  . Minutes of Exercise per Session: Not on file  Stress:   . Feeling of Stress : Not on file  Social Connections:   . Frequency of Communication with Friends and Family: Not on file  . Frequency of Social Gatherings with Friends and Family: Not on file  . Attends Religious Services: Not on file  . Active Member of Clubs or Organizations: Not on file  . Attends Banker Meetings: Not on file  . Marital Status: Not on file  Intimate Partner Violence:   . Fear of Current or Ex-Partner: Not on file  . Emotionally Abused: Not on file  . Physically Abused: Not on file  . Sexually Abused: Not on file     No family history on file.   Review of Systems: All other systems reviewed and are otherwise negative except as noted above.  Physical Exam: Vitals:   03/10/19 2350 03/11/19 0000 03/11/19 0035 03/11/19 0539  BP: 120/69 (!) 103/51 (!) 115/55 (!) 115/56  Pulse:  77 (!) 53 78  Resp: 14 14 19  (!) 23  Temp:   98 F (36.7 C) 98.2 F (36.8 C)  TempSrc:   Oral Oral  SpO2:  99%  97%  Weight:   57.5 kg   Height:   5\' 5"  (1.651 m)     GEN- The patient is well appearing, alert and oriented x 3 today.   HEENT: normocephalic, atraumatic; sclera clear, conjunctiva pink; hearing intact; oropharynx clear; neck supple Lungs- Clear to ausculation bilaterally, normal work of breathing.  No wheezes, rales, rhonchi Heart- Regular rate and rhythm, no murmurs, rubs or gallops GI- soft, non-tender, non-distended, bowel sounds  present Extremities- no clubbing, cyanosis, or edema; DP/PT/radial pulses 2+ bilaterally MS- no significant deformity or atrophy Skin- warm and dry, no rash or lesion Psych- euthymic mood, full affect Neuro- strength and sensation are intact  Labs:   Lab Results  Component Value Date   WBC 8.4 03/10/2019   HGB 14.2 03/10/2019   HCT 44.1 03/10/2019   MCV 94.8 03/10/2019   PLT 234 03/10/2019    Recent  Labs  Lab 03/11/19 0137  NA 136  K 3.7  CL 105  CO2 20*  BUN 30*  CREATININE 1.01*  CALCIUM 8.3*  PROT 5.3*  BILITOT 0.7  ALKPHOS 50  ALT 91*  AST 73*  GLUCOSE 118*      Radiology/Studies: DG Chest Portable 1 View  Result Date: 03/10/2019 CLINICAL DATA:  Bradycardia and weakness EXAM: PORTABLE CHEST 1 VIEW COMPARISON:  None. FINDINGS: The heart size and mediastinal contours are within normal limits. There is hyperinflation of the with flattening of the hemidiaphragms. Aortic knob calcifications. The visualized skeletal structures are unremarkable. IMPRESSION: No active disease. Electronically Signed   By: Prudencio Pair M.D.   On: 03/10/2019 21:04    EKG: CHB with junctional beat at 46 bpm and ? PVC (personally reviewed)  TELEMETRY: Sinus brady with intermittently dropped beats and PVCs (personally reviewed)  Assessment/Plan: 1.  Advanced AV block including CHB She is symptomatic and meets criteria for pacemaker implantation. No AV nodal blocking agents on board. Echo pending.  We discussed the benefits and risks of pacemaker implantation at length, including bleeding, infection, damage to the heart or lungs, heart attack, stroke, or death.  She verbalizes understanding and is agreeable to proceed pending Echo, but is also anxious to discuss it further with Dr. Rayann Heman.   2. Mild transaminitis ? In the setting of low perfusion with CHB. Primary following.   For questions or updates, please contact [place] Please consult [email or web address] for contact info under  Cardiology/STEMI.  Signed, Shirley Friar, PA-C  03/11/2019 7:08 AM   I have seen, examined the patient, and reviewed the above assessment and plan.  Changes to above are made where necessary.  On exam, RRR.  She is thin and healthy appearing.  Elderly. Echo reveals normal EF.  No significant valvular disease. The patient presented with very symptomatic complete heart block.  She had slow ventricular escape with presyncope.  I would therefore recommend pacemaker implantation at this time.   We discussed leadless pacing and a transvenous pacing system at length. She would prefer a traditional system. Risks, benefits, alternatives to pacemaker implantation were discussed in detail with the patient today. The patient understands that the risks include but are not limited to bleeding, infection, pneumothorax, perforation, tamponade, vascular damage, renal failure, MI, stroke, death,  and lead dislodgement and wishes to proceed. We will therefore schedule the procedure at the next available time.  We also discussed remote monitoring and its role today.   Co Sign: Thompson Grayer, MD 03/11/2019 10:51 AM

## 2019-03-11 NOTE — Discharge Summary (Signed)
ELECTROPHYSIOLOGY PROCEDURE DISCHARGE SUMMARY    Patient ID: Lindsay Frazier,  MRN: [id number], DOB/AGE: 84/18/1935 84 y.o.  Admit date: 03/10/2019 Discharge date: 03/11/2019  Primary Care Physician: Chilton Greathouse, MD  Primary Cardiologist: No primary care provider on file.  Electrophysiologist: Dr. Johney Frame  Primary Discharge Diagnosis:  Complete Heart Block status post pacemaker implantation this admission  No Known Allergies   Procedures This Admission:  1.  Implantation of a Medtronic dual chamber PPM on 03/11/19 by Dr. Johney Frame.  The patient received a Medtronic model number W1 PPM with model number 5076 right atrial lead and 5383-69 right ventricular lead. There were no immediate post procedure complications. 2.  CXR on 03/11/19  demonstrated no pneumothorax status post device implantation.   Brief HPI: Lindsay Frazier is a 86 y.o. female was admitted for CHB and electrophysiology team asked to see for consideration of PPM implantation.  Past medical history includes GERD and arthritis.  The patient has had symptomatic bradycardia without reversible causes identified.  Risks, benefits, and alternatives to PPM implantation were reviewed with the patient who wished to proceed.   Hospital Course:  The patient was admitted and underwent implantation of a Medtronic dual chamber PPM with details as outlined above.  She was determined stable for same day discharge. Left chest was without hematoma with large bandage in place on discharge, to be removed 03/12/2019, leaving steri-strips in place. The device was interrogated and found to be functioning normally.  CXR was obtained and demonstrated no pneumothorax status post device implantation.  Wound care, arm mobility, and restrictions were reviewed at lenght with the patient.  The patient was examined and considered stable for discharge to home with close device clinic follow up as below.   Anticoagulation resumption This patient is not  on anticoagulation.  Physical Exam: Vitals:   03/11/19 0035 03/11/19 0539 03/11/19 1055 03/11/19 1321  BP: (!) 115/55 (!) 115/56  139/74  Pulse: (!) 53 78  60  Resp: 19 (!) 23  15  Temp: 98 F (36.7 C) 98.2 F (36.8 C)    TempSrc: Oral Oral  Oral  SpO2:  97% 100% 97%  Weight: 57.5 kg     Height: 5\' 5"  (1.651 m)      Labs:   Lab Results  Component Value Date   WBC 8.4 03/10/2019   HGB 14.2 03/10/2019   HCT 44.1 03/10/2019   MCV 94.8 03/10/2019   PLT 234 03/10/2019    Recent Labs  Lab 03/11/19 0137  NA 136  K 3.7  CL 105  CO2 20*  BUN 30*  CREATININE 1.01*  CALCIUM 8.3*  PROT 5.3*  BILITOT 0.7  ALKPHOS 50  ALT 91*  AST 73*  GLUCOSE 118*    Discharge Medications:  Allergies as of 03/11/2019   No Known Allergies     Medication List    STOP taking these medications   rivaroxaban 10 MG Tabs tablet Commonly known as: XARELTO     TAKE these medications   acetaminophen 325 MG tablet Commonly known as: TYLENOL Take 1-2 tablets (325-650 mg total) by mouth every 4 (four) hours as needed for mild pain.   hydrocortisone 2.5 % cream Apply 1 application topically daily as needed (dermatitis).   ketoconazole 2 % cream Commonly known as: NIZORAL Apply 1 application topically daily as needed (dermatitis).   Magnesium 250 MG Tabs Take 250 mg by mouth daily.   methocarbamol 500 MG tablet Commonly known as: ROBAXIN Take 1  tablet (500 mg total) by mouth every 6 (six) hours as needed for muscle spasms.   norethindrone 5 MG tablet Commonly known as: AYGESTIN Take 5 mg by mouth See admin instructions. Take 5 mg by mouth once daily for 15 days each month   oxyCODONE 5 MG immediate release tablet Commonly known as: Oxy IR/ROXICODONE Take 1-2 tablets (5-10 mg total) by mouth every 3 (three) hours as needed for moderate pain or severe pain.   Potassium 99 MG Tabs Take 99 mg by mouth daily.   Premarin 0.45 MG tablet Generic drug: estrogens (conjugated) Take  0.45 mg by mouth daily.   terconazole 0.8 % vaginal cream Commonly known as: TERAZOL 3 Place 1 applicator vaginally at bedtime. For 3 days   traMADol 50 MG tablet Commonly known as: ULTRAM Take 1-2 tablets (50-100 mg total) by mouth every 6 (six) hours as needed for moderate pain.       Disposition:   Follow-up Information    Thompson Grayer, MD Follow up on 06/22/2019.   Specialty: Cardiology Why: at 215 for 3 month post pacemaker follow up Contact information: [address] [place] [address] [phone]         [facility] Follow up on 03/24/2019.   Why: at 2 pm for post pacemaker wound check Contact information: [place] [id number] [phone]          Duration of Discharge Encounter: Greater than 30 minutes including physician time.  Jacalyn Lefevre, PA-C  03/11/2019 4:05 PM

## 2019-03-11 NOTE — H&P (View-Only) (Signed)
ELECTROPHYSIOLOGY CONSULT NOTE    Patient ID: Lindsay Frazier MRN: [id number], DOB/AGE: Apr 04, 1933 86 y.o.  Admit date: 03/10/2019 Date of Consult: 03/11/2019  Primary Physician: Chilton Greathouse, MD Primary Cardiologist: No primary care provider on file.  Electrophysiologist: New   Referring Provider: Dr. Jacinto Halim  Patient Profile: Lindsay Frazier is a 86 y.o. female with a history of arthritis and GERD who is being seen today for the evaluation of CHB at the request of Dr. Jacinto Halim.  HPI:  Lindsay Frazier is a 86 y.o. female with medical history above. She was her USOH until yesterday morning when she noticed marked fatigue, SOB, and dizziness. She checked her BP and her HR was in the 30s. Her son, Dr. Dominica Severin visited her and confirmed her HR and advised ER.  She denies chest pain, SOB, peripheral edema, PND, or orthopnea.  She is not on any AV nodal blocking agents.   EKG on admission showed complete heart block with junctional escape at 46 bpm with 1? PVC  Usually, she can do all of her house and yard work without difficulty. Yesterday, she was SOB walking from her bedroom to the kitchen with lightheadedness. No frank syncope, but + near syncope. Denies CP or any history of ischemic symptoms. She has problems with reflux, and is worried her empty stomach will exacerbate this.   Past Medical History:  Diagnosis Date  . Arthritis   . GERD (gastroesophageal reflux disease)   . Irregular heart beat   . Yeast infection of the vagina    current on 11/14/2015 per patient      Surgical History:  Past Surgical History:  Procedure Laterality Date  . TOTAL KNEE ARTHROPLASTY Left 11/21/2015   Procedure: LEFT TOTAL KNEE ARTHROPLASTY;  Surgeon: Ollen Gross, MD;  Location: [facility];  Service: Orthopedics;  Laterality: Left;     Medications Prior to Admission  Medication Sig Dispense Refill Last Dose  . hydrocortisone 2.5 % cream Apply 1 application topically daily as needed  (dermatitis).      Marland Kitchen ketoconazole (NIZORAL) 2 % cream Apply 1 application topically daily as needed (dermatitis).     . Magnesium 250 MG TABS Take 250 mg by mouth daily.     . methocarbamol (ROBAXIN) 500 MG tablet Take 1 tablet (500 mg total) by mouth every 6 (six) hours as needed for muscle spasms. 80 tablet 0   . norethindrone (AYGESTIN) 5 MG tablet Take 5 mg by mouth See admin instructions. Take 5 mg by mouth once daily for 15 days each month     . oxyCODONE (OXY IR/ROXICODONE) 5 MG immediate release tablet Take 1-2 tablets (5-10 mg total) by mouth every 3 (three) hours as needed for moderate pain or severe pain. 80 tablet 0   . Potassium 99 MG TABS Take 99 mg by mouth daily.     Marland Kitchen PREMARIN 0.45 MG tablet Take 0.45 mg by mouth daily.     . rivaroxaban (XARELTO) 10 MG TABS tablet Take 1 tablet (10 mg total) by mouth daily with breakfast. Take Xarelto for two and a half more weeks, then discontinue Xarelto. Once the patient has completed the Xarelto, they may resume the 81 mg Aspirin. 19 tablet 0   . terconazole (TERAZOL 3) 0.8 % vaginal cream Place 1 applicator vaginally at bedtime. For 3 days     . traMADol (ULTRAM) 50 MG tablet Take 1-2 tablets (50-100 mg total) by mouth every 6 (six) hours as needed for moderate pain.  80 tablet 1     Inpatient Medications:  . insulin aspart  0-9 Units Subcutaneous TID WC    Allergies: No Known Allergies  Social History   Socioeconomic History  . Marital status: Divorced    Spouse name: Not on file  . Number of children: Not on file  . Years of education: Not on file  . Highest education level: Not on file  Occupational History  . Not on file  Tobacco Use  . Smoking status: Former Smoker    Years: 5.00  . Smokeless tobacco: Never Used  Substance and Sexual Activity  . Alcohol use: Yes    Comment: glass of wine daily   . Drug use: No  . Sexual activity: Not on file  Other Topics Concern  . Not on file  Social History Narrative  . Not on  file   Social Determinants of Health   Financial Resource Strain:   . Difficulty of Paying Living Expenses: Not on file  Food Insecurity:   . Worried About Programme researcher, broadcasting/film/video in the Last Year: Not on file  . Ran Out of Food in the Last Year: Not on file  Transportation Needs:   . Lack of Transportation (Medical): Not on file  . Lack of Transportation (Non-Medical): Not on file  Physical Activity:   . Days of Exercise per Week: Not on file  . Minutes of Exercise per Session: Not on file  Stress:   . Feeling of Stress : Not on file  Social Connections:   . Frequency of Communication with Friends and Family: Not on file  . Frequency of Social Gatherings with Friends and Family: Not on file  . Attends Religious Services: Not on file  . Active Member of Clubs or Organizations: Not on file  . Attends Banker Meetings: Not on file  . Marital Status: Not on file  Intimate Partner Violence:   . Fear of Current or Ex-Partner: Not on file  . Emotionally Abused: Not on file  . Physically Abused: Not on file  . Sexually Abused: Not on file     No family history on file.   Review of Systems: All other systems reviewed and are otherwise negative except as noted above.  Physical Exam: Vitals:   03/10/19 2350 03/11/19 0000 03/11/19 0035 03/11/19 0539  BP: 120/69 (!) 103/51 (!) 115/55 (!) 115/56  Pulse:  77 (!) 53 78  Resp: 14 14 19  (!) 23  Temp:   98 F (36.7 C) 98.2 F (36.8 C)  TempSrc:   Oral Oral  SpO2:  99%  97%  Weight:   57.5 kg   Height:   5\' 5"  (1.651 m)     GEN- The patient is well appearing, alert and oriented x 3 today.   HEENT: normocephalic, atraumatic; sclera clear, conjunctiva pink; hearing intact; oropharynx clear; neck supple Lungs- Clear to ausculation bilaterally, normal work of breathing.  No wheezes, rales, rhonchi Heart- Regular rate and rhythm, no murmurs, rubs or gallops GI- soft, non-tender, non-distended, bowel sounds  present Extremities- no clubbing, cyanosis, or edema; DP/PT/radial pulses 2+ bilaterally MS- no significant deformity or atrophy Skin- warm and dry, no rash or lesion Psych- euthymic mood, full affect Neuro- strength and sensation are intact  Labs:   Lab Results  Component Value Date   WBC 8.4 03/10/2019   HGB 14.2 03/10/2019   HCT 44.1 03/10/2019   MCV 94.8 03/10/2019   PLT 234 03/10/2019    Recent  Labs  Lab 03/11/19 0137  NA 136  K 3.7  CL 105  CO2 20*  BUN 30*  CREATININE 1.01*  CALCIUM 8.3*  PROT 5.3*  BILITOT 0.7  ALKPHOS 50  ALT 91*  AST 73*  GLUCOSE 118*      Radiology/Studies: DG Chest Portable 1 View  Result Date: 03/10/2019 CLINICAL DATA:  Bradycardia and weakness EXAM: PORTABLE CHEST 1 VIEW COMPARISON:  None. FINDINGS: The heart size and mediastinal contours are within normal limits. There is hyperinflation of the with flattening of the hemidiaphragms. Aortic knob calcifications. The visualized skeletal structures are unremarkable. IMPRESSION: No active disease. Electronically Signed   By: Prudencio Pair M.D.   On: 03/10/2019 21:04    EKG: CHB with junctional beat at 46 bpm and ? PVC (personally reviewed)  TELEMETRY: Sinus brady with intermittently dropped beats and PVCs (personally reviewed)  Assessment/Plan: 1.  Advanced AV block including CHB She is symptomatic and meets criteria for pacemaker implantation. No AV nodal blocking agents on board. Echo pending.  We discussed the benefits and risks of pacemaker implantation at length, including bleeding, infection, damage to the heart or lungs, heart attack, stroke, or death.  She verbalizes understanding and is agreeable to proceed pending Echo, but is also anxious to discuss it further with Dr. Rayann Heman.   2. Mild transaminitis ? In the setting of low perfusion with CHB. Primary following.   For questions or updates, please contact [place] Please consult [email or web address] for contact info under  Cardiology/STEMI.  Signed, Shirley Friar, PA-C  03/11/2019 7:08 AM   I have seen, examined the patient, and reviewed the above assessment and plan.  Changes to above are made where necessary.  On exam, RRR.  She is thin and healthy appearing.  Elderly. Echo reveals normal EF.  No significant valvular disease. The patient presented with very symptomatic complete heart block.  She had slow ventricular escape with presyncope.  I would therefore recommend pacemaker implantation at this time.   We discussed leadless pacing and a transvenous pacing system at length. She would prefer a traditional system. Risks, benefits, alternatives to pacemaker implantation were discussed in detail with the patient today. The patient understands that the risks include but are not limited to bleeding, infection, pneumothorax, perforation, tamponade, vascular damage, renal failure, MI, stroke, death,  and lead dislodgement and wishes to proceed. We will therefore schedule the procedure at the next available time.  We also discussed remote monitoring and its role today.   Co Sign: Thompson Grayer, MD 03/11/2019 10:51 AM

## 2019-03-11 NOTE — Progress Notes (Signed)
Subjective:  Feels well. No lightheadedness. She asks if there is any alternate option to pacemaker placement for her condition.   Objective:  Vital Signs in the last 24 hours: Temp:  [98 F (36.7 C)-98.6 F (37 C)] 98 F (36.7 C) (02/24 0035) Pulse Rate:  [36-77] 53 (02/24 0035) Resp:  [10-22] 19 (02/24 0035) BP: (82-131)/(49-93) 115/55 (02/24 0035) SpO2:  [99 %-100 %] 99 % (02/24 0000) Weight:  [57.5 kg-59 kg] 57.5 kg (02/24 0035)  Intake/Output from previous day: 02/23 0701 - 02/24 0700 In: 427.6 [I.V.:427.6] Out: -   Physical Exam  Constitutional: She is oriented to person, place, and time. She appears well-developed and well-nourished. No distress.  HENT:  Head: Normocephalic and atraumatic.  Eyes: Pupils are equal, round, and reactive to light. Conjunctivae are normal.  Neck: No JVD present.  Cardiovascular: Regular rhythm, normal heart sounds and intact distal pulses. Bradycardia present.  Pulmonary/Chest: Effort normal and breath sounds normal. She has no wheezes. She has no rales.  Abdominal: Soft. Bowel sounds are normal. There is no rebound.  Musculoskeletal:        General: No edema.  Lymphadenopathy:    She has no cervical adenopathy.  Neurological: She is alert and oriented to person, place, and time. No cranial nerve deficit.  Skin: Skin is warm and dry.  Psychiatric: She has a normal mood and affect.  Nursing note and vitals reviewed.    Lab Results: BMP Recent Labs    03/10/19 2045 03/11/19 0137  NA 132* 136  K 4.2 3.7  CL 100 105  CO2 18* 20*  GLUCOSE 158* 118*  BUN 30* 30*  CREATININE 1.20* 1.01*  CALCIUM 8.8* 8.3*  GFRNONAA 41* 51*  GFRAA 48* 59*    CBC Recent Labs  Lab 03/10/19 2045  WBC 8.4  RBC 4.65  HGB 14.2  HCT 44.1  PLT 234  MCV 94.8  MCH 30.5  MCHC 32.2  RDW 14.3  LYMPHSABS 1.1  MONOABS 0.3  EOSABS 0.0  BASOSABS 0.0    HEMOGLOBIN A1C Lab Results  Component Value Date   HGBA1C 5.7 (H) 03/11/2019   MPG 116.89  03/11/2019    TSH Recent Labs    03/10/19 2046  TSH 5.249*    Hepatic Function Panel Recent Labs    03/10/19 2045 03/11/19 0137  PROT 6.4* 5.3*  ALBUMIN 3.4* 2.7*  AST 104* 73*  ALT 113* 91*  ALKPHOS 60 50  BILITOT 0.8 0.7     Cardiac Studies:  Telemetry 03/11/2019: Sinus rhythm with first degree AV block.  EKG 03/10/2019:  Underlying sinus rhythm with complete heart block, with junctional escape rhythm at rate of 30 bpm, rare PACs.  Echocardiogram: Pending  Assessment & Recommendations:  86 y/o Caucasian female with complete heart block  Complete heart block: Intermittent. Present on admission. Currently, she is in sinus rhythm with first degree AV block.  Not on any AV nodal blocking agents. (Not on any medical treatment other than hormone replacement per the patient) TSH only mildly elevated at 5.2, would not explain her third degree AV block on presentation. Echocardiogram and EP consult pending.   Elevated LFT: Likely secondary to CHB. Improving.    Elder Negus, M.D. [facility] Pager: [phone] Office: [phone]

## 2019-03-11 NOTE — Progress Notes (Signed)
  Echocardiogram 2D Echocardiogram has been performed.  Lindsay Frazier 03/11/2019, 8:45 AM

## 2019-03-12 MED FILL — Lidocaine HCl Local Inj 1%: INTRAMUSCULAR | Qty: 80 | Status: AC

## 2019-03-13 ENCOUNTER — Telehealth: Payer: Self-pay

## 2019-03-13 NOTE — Telephone Encounter (Signed)
Location of hospitalization: [place] Reason for hospitalization: Pacemaker Implant Date of discharge: 03/11/2019 Date of first communication with patient: today Person contacting patient: Me Current symptoms:  Do you understand why you were in the Hospital: Yes Questions regarding discharge instructions: None Where were you discharged to: Home Medications reviewed: Yes Allergies reviewed: Yes Dietary changes reviewed: Yes. Discussed low fat and low salt diet.  Referals reviewed: NA Activities of Daily Living: Able to with mild limitations Any transportation issues/concerns: None Any patient concerns: None Confirmed importance & date/time of Follow up appt: Yes Confirmed with patient if condition begins to worsen call. Pt was given the office number and encouraged to call back with questions or concerns: Yes

## 2019-03-13 NOTE — Telephone Encounter (Signed)
-----   Message from [name], MD sent at 03/11/2019  4:23 PM EST ----- Regarding: March 3rd is good Please do TOC, she is being discharged today

## 2019-03-18 ENCOUNTER — Encounter: Payer: Self-pay | Admitting: Cardiology

## 2019-03-18 ENCOUNTER — Ambulatory Visit: Payer: Medicare Other | Attending: Internal Medicine

## 2019-03-18 ENCOUNTER — Other Ambulatory Visit: Payer: Self-pay

## 2019-03-18 ENCOUNTER — Ambulatory Visit: Payer: Medicare Other | Admitting: Cardiology

## 2019-03-18 VITALS — BP 160/88 | HR 84 | Temp 98.0°F | Resp 16 | Ht 65.0 in | Wt 125.8 lb

## 2019-03-18 DIAGNOSIS — N1831 Chronic kidney disease, stage 3a: Secondary | ICD-10-CM

## 2019-03-18 DIAGNOSIS — Z23 Encounter for immunization: Secondary | ICD-10-CM | POA: Insufficient documentation

## 2019-03-18 DIAGNOSIS — I442 Atrioventricular block, complete: Secondary | ICD-10-CM

## 2019-03-18 DIAGNOSIS — Z950 Presence of cardiac pacemaker: Secondary | ICD-10-CM

## 2019-03-18 DIAGNOSIS — R7989 Other specified abnormal findings of blood chemistry: Secondary | ICD-10-CM

## 2019-03-18 NOTE — Progress Notes (Signed)
Primary Physician/Referring:  Chilton Greathouse, MD  Patient ID: Lindsay Frazier, female    DOB: 1933/11/15, 86 y.o.   MRN: [id number]  Chief Complaint  Patient presents with  . Transitions Of Care  . Heart block  . Hypertension   HPI:    Lindsay Frazier  is a 86 y.o. Caucasian female patient with no significant medical history, not on any medications except  Estrogen replacement, Started noticing marked fatigue, shortness of breath and dizziness, admitted on 03/10/2019 with complete heart block, underwent successful pacemaker implantation with Medtronic dual-chamber pacemaker with His bundle pacing.  She was discharged on the same day.  During hospitalization she was found to have mild renal insufficiency, elevated LFTs, blood pressure was also elevated along with mildly elevated A1c and TSH.  She prefers holistic type medicines.  She now presents for a 1 week TOC visit for follow-up of complete heart block.  States that she is slightly distressed due to alteration in her physiology and pacemaker implantation.  She has been worried about her heart rate as well.  Past Medical History:  Diagnosis Date  . Arthritis   . Complete heart block (HCC) 03/10/2019  . Encounter for care of pacemaker 03/11/2019  . GERD (gastroesophageal reflux disease)   . Irregular heart beat   . Pacemaker Dual chamber Medtronic Azure XT MRI conditional dual-chamber pacemaker with left bundle pacing 03/11/2019 03/11/2019  . Yeast infection of the vagina    current on 11/14/2015 per patient    Past Surgical History:  Procedure Laterality Date  . PACEMAKER IMPLANT N/A 03/11/2019   Procedure: PACEMAKER IMPLANT;  Surgeon: Hillis Range, MD;  Location: [facility];  Service: Cardiovascular;  Laterality: N/A;  . TOTAL KNEE ARTHROPLASTY Left 11/21/2015   Procedure: LEFT TOTAL KNEE ARTHROPLASTY;  Surgeon: Ollen Gross, MD;  Location: [facility];  Service: Orthopedics;  Laterality: Left;   History reviewed. No pertinent  family history.  Social History   Tobacco Use  . Smoking status: Former Smoker    Years: 5.00  . Smokeless tobacco: Never Used  Substance Use Topics  . Alcohol use: Yes    Comment: glass of wine daily    ROS  Review of Systems  Constitution: Positive for malaise/fatigue.  Cardiovascular: Negative for chest pain, dyspnea on exertion and leg swelling.  Gastrointestinal: Positive for heartburn. Negative for melena.   Objective  Blood pressure (!) 160/88, pulse 84, temperature 98 F (36.7 C), temperature source Temporal, resp. rate 16, height 5\' 5"  (1.651 m), weight 125 lb 12.8 oz (57.1 kg).  Vitals with BMI 03/18/2019 03/18/2019 03/11/2019  Height - 5\' 5"  -  Weight - 125 lbs 13 oz -  BMI - 20.93 -  Systolic 160 166 03/13/2019  Diastolic 88 81 74  Pulse 84 72 60     Physical Exam  Cardiovascular: Normal rate, regular rhythm, normal heart sounds and intact distal pulses. Exam reveals no gallop.  No murmur heard. No leg edema, no JVD.  Pulmonary/Chest: Effort normal and breath sounds normal.  Pacemaker/ICD site noted  in the left infraclavicular fossa.   Abdominal: Soft. Bowel sounds are normal.   Laboratory examination:   Recent Labs    03/10/19 2045 03/11/19 0137  NA 132* 136  K 4.2 3.7  CL 100 105  CO2 18* 20*  GLUCOSE 158* 118*  BUN 30* 30*  CREATININE 1.20* 1.01*  CALCIUM 8.8* 8.3*  GFRNONAA 41* 51*  GFRAA 48* 59*   estimated creatinine clearance is 37.3  mL/min (A) (by C-G formula based on SCr of 1.01 mg/dL (H)).  CMP Latest Ref Rng & Units 03/11/2019 03/10/2019 11/23/2015  Glucose 70 - 99 mg/dL 118(H) 158(H) 96  BUN 8 - 23 mg/dL 30(H) 30(H) 7  Creatinine 0.44 - 1.00 mg/dL 1.01(H) 1.20(H) 0.49  Sodium 135 - 145 mmol/L 136 132(L) 140  Potassium 3.5 - 5.1 mmol/L 3.7 4.2 3.8  Chloride 98 - 111 mmol/L 105 100 107  CO2 22 - 32 mmol/L 20(L) 18(L) 26  Calcium 8.9 - 10.3 mg/dL 8.3(L) 8.8(L) 8.1(L)  Total Protein 6.5 - 8.1 g/dL 5.3(L) 6.4(L) -  Total Bilirubin 0.3 - 1.2 mg/dL  0.7 0.8 -  Alkaline Phos 38 - 126 U/L 50 60 -  AST 15 - 41 U/L 73(H) 104(H) -  ALT 0 - 44 U/L 91(H) 113(H) -   CBC Latest Ref Rng & Units 03/10/2019 11/23/2015 11/22/2015  WBC 4.0 - 10.5 K/uL 8.4 7.2 8.1  Hemoglobin 12.0 - 15.0 g/dL 14.2 10.6(L) 10.6(L)  Hematocrit 36.0 - 46.0 % 44.1 32.2(L) 32.1(L)  Platelets 150 - 400 K/uL 234 173 167   Lipid Panel  No results found for: CHOL, TRIG, HDL, CHOLHDL, VLDL, LDLCALC, LDLDIRECT HEMOGLOBIN A1C Lab Results  Component Value Date   HGBA1C 5.7 (H) 03/11/2019   MPG 116.89 03/11/2019   TSH Recent Labs    03/10/19 2046  TSH 5.249*   External labs:   Medications and allergies  No Known Allergies   Current Outpatient Medications  Medication Instructions  . Multiple Vitamin (MULTIVITAMIN ADULT PO) 1 tablet, Oral, Daily  . norethindrone (AYGESTIN) 5 mg, Oral, See admin instructions, Take 5 mg by mouth once daily for 15 days each month  . Premarin 0.45 mg, Oral, Daily  . VITAMIN D PO 1 tablet, Oral, Daily    Radiology:   Chest x-ray PA and lateral view 03/11/2019: New pacing leads are in the right atrium and right ventricle. Negative for pneumothorax or acute disease.  Cardiac Studies:   Echocardiogram 03/11/2019:  1. Left ventricular ejection fraction, by estimation, is 55 to 60%. The  left ventricle has normal function. The left ventricle has no regional  wall motion abnormalities. Left ventricular diastolic parameters were normal.  2. Right ventricular systolic function is normal. The right ventricular  size is normal. There is normal pulmonary artery systolic pressure.  3. The mitral valve is normal in structure and function. Trivial mitral  valve regurgitation. Mild mitral annular calcification.  4. The inferior vena cava is normal in size with <50% respiratory  variability, suggesting right atrial pressure of 8 mmHg.  EKG 03/18/2019: Sinus rhythm with first-degree AV block at the rate of 83 bpm, His bundle pacing noted.   Ventricularly paced.    Assessment     ICD-10-CM   1. Complete heart block (HCC)  I44.2 EKG 12-Lead  2. Pacemaker Dual chamber Medtronic Azure XT MRI conditional dual-chamber pacemaker with left bundle pacing 03/11/2019  Z95.0   3. Stage 3a chronic kidney disease  N18.31   4. Abnormal TSH  R79.89      No orders of the defined types were placed in this encounter.   Medications Discontinued During This Encounter  Medication Reason  . acetaminophen (TYLENOL) 325 MG tablet Error  . hydrocortisone 2.5 % cream Error  . ketoconazole (NIZORAL) 2 % cream Error  . oxyCODONE (OXY IR/ROXICODONE) 5 MG immediate release tablet Error  . traMADol (ULTRAM) 50 MG tablet Error  . methocarbamol (ROBAXIN) 500 MG tablet Error  Recommendations:   Lindsay Frazier  is a 86 y.o. Caucasian female patient with no significant medical history, not on any medications except  Estrogen replacement, Started noticing marked fatigue, shortness of breath and dizziness, admitted on 03/10/2019 with complete heart block, underwent successful pacemaker implantation with Medtronic dual-chamber pacemaker with His bundle pacing.  She was discharged on the same day.  Her blood pressure was elevated today, she does not want to start any medication and prefers holistic type medications for now, states that she will continue to observe this.  But she is willing to listen to [place], she has an appointment to see Dr. Felipa Eth with labs next week and appointment in 2 weeks.  I have discussed with her daughter-in-law regarding elevated serum creatinine, abnormal electrolytes and also TSH and hopefully this will reversed.  Suspect hypertensive renovascular disease as etiology for chronic renal insufficiency.    Her pacemaker site has healed well, Steri-Strips in place, no fluctuation or discharge.  She also had metabolic acidosis and abnormal LFTs related to complete heart block.  We will follow up on the labs along with her PCP.   I would  like to see her back in 6 weeks to improve compliance and also for shared decision making regarding management of her hypertension and complete heart block.  I will take over the management of her pacemaker when she is released from the EP.  This is a 1 week TOC visit.  [name], MD, [facility] 03/18/2019, 1:05 PM [facility] Office: [phone]  CC Drs: Shela Commons. Allred, R. Avva

## 2019-03-18 NOTE — Progress Notes (Signed)
   Covid-19 Vaccination Clinic  Name:  Lindsay Frazier    MRN: [id number] DOB: [birth year]  03/18/2019  Lindsay Frazier was observed post Covid-19 immunization for 15 minutes without incident. She was provided with Vaccine Information Sheet and instruction to access the V-Safe system.   Lindsay Frazier was instructed to call 911 with any severe reactions post vaccine: Marland Kitchen Difficulty breathing  . Swelling of face and throat  . A fast heartbeat  . A bad rash all over body  . Dizziness and weakness   Immunizations Administered    Name Date Dose VIS Date Route   Pfizer COVID-19 Vaccine 03/18/2019  2:38 PM 0.3 mL 12/26/2018 Intramuscular   Manufacturer: [employer], [employer]   Lot: [id number]   NDC: [address]-4

## 2019-03-24 ENCOUNTER — Ambulatory Visit (INDEPENDENT_AMBULATORY_CARE_PROVIDER_SITE_OTHER): Payer: Medicare Other | Admitting: *Deleted

## 2019-03-24 ENCOUNTER — Other Ambulatory Visit: Payer: Self-pay

## 2019-03-24 DIAGNOSIS — I442 Atrioventricular block, complete: Secondary | ICD-10-CM | POA: Diagnosis not present

## 2019-03-24 LAB — CUP PACEART INCLINIC DEVICE CHECK
Battery Remaining Longevity: 137 mo
Battery Voltage: 3.21 V
Brady Statistic AP VP Percent: 0.93 %
Brady Statistic AP VS Percent: 0 %
Brady Statistic AS VP Percent: 98.74 %
Brady Statistic AS VS Percent: 0.33 %
Brady Statistic RA Percent Paced: 0.97 %
Brady Statistic RV Percent Paced: 99.64 %
Date Time Interrogation Session: 20210309141400
Implantable Lead Implant Date: 20210224
Implantable Lead Implant Date: 20210224
Implantable Lead Location: 753859
Implantable Lead Location: 753860
Implantable Lead Model: 3830
Implantable Lead Model: 5076
Implantable Pulse Generator Implant Date: 20210224
Lead Channel Impedance Value: 361 Ohm
Lead Channel Impedance Value: 361 Ohm
Lead Channel Impedance Value: 494 Ohm
Lead Channel Impedance Value: 494 Ohm
Lead Channel Pacing Threshold Amplitude: 0.75 V
Lead Channel Pacing Threshold Amplitude: 1 V
Lead Channel Pacing Threshold Pulse Width: 0.4 ms
Lead Channel Pacing Threshold Pulse Width: 0.4 ms
Lead Channel Sensing Intrinsic Amplitude: 19.125 mV
Lead Channel Sensing Intrinsic Amplitude: 2.125 mV
Lead Channel Setting Pacing Amplitude: 3.5 V
Lead Channel Setting Pacing Amplitude: 3.5 V
Lead Channel Setting Pacing Pulse Width: 0.4 ms
Lead Channel Setting Sensing Sensitivity: 1.2 mV

## 2019-03-24 NOTE — Patient Instructions (Signed)
Call office if you have any drainage , redness, or swelling at wound site.  [facility]- [phone]

## 2019-03-24 NOTE — Progress Notes (Signed)
Wound check appointment. Steri-strips removed. Wound without redness or edema. Incision edges approximated, wound well healed. Normal device function. Thresholds, sensing, and impedances consistent with implant measurements. Device programmed at 3.5V/auto capture programmed on for extra safety margin until 3 month visit. Histogram distribution appropriate for patient and level of activity. AT/AF burden 0.1%, 2 AT/AF events with EGMs show AF with controlled v-rates, the longest episode lasted 1 minute and 30 minutes. No high ventricular rates noted. Patient educated about wound care, arm mobility, lifting restrictions. ROV on 06/22/19 with Dr Johney Frame.

## 2019-03-26 NOTE — Progress Notes (Signed)
Lab 03/17/2019: Serum glucose 88 mg, BUN 13, creatinine 0.7, EGFR >60 mL.  Sodium 142, potassium 4.4, CMP otherwise normal.  Hb 13.8/HCT 44.5, platelets 175, normal indicis.  Total cholesterol 170, triglycerides 40, HDL 58, LDL 104.  Non-HDL cholesterol 112.  TSH 1.77, normal.

## 2019-05-05 NOTE — Progress Notes (Signed)
Primary Physician/Referring:  Prince Solian, MD  Patient ID: Lindsay Frazier, female    DOB: [birth year], 86 y.o.   MRN: [id number]  Chief Complaint  Patient presents with  . Hypertension  . Follow-up    6 weeks   HPI:    Lindsay Frazier  is a 86 y.o. Caucasian female patient with no significant medical history, not on any medications except  Estrogen replacement, Started noticing marked fatigue, shortness of breath and dizziness, admitted on 03/10/2019 with complete heart block, underwent successful pacemaker implantation with Medtronic dual-chamber pacemaker with His bundle pacing.    She prefers holistic type medicines.  She has recuperated well, has resumed all of activity and has not had any further dizziness or syncope or fatigue.[id number]  Past Medical History:  Diagnosis Date  . Arthritis   . Complete heart block ([place]) 03/10/2019  . Encounter for care of pacemaker 03/11/2019  . GERD (gastroesophageal reflux disease)   . Irregular heart beat   . Pacemaker Dual chamber Medtronic Azure XT MRI conditional dual-chamber pacemaker with left bundle pacing 03/11/2019 03/11/2019  . Yeast infection of the vagina    current on 11/14/2015 per patient    Past Surgical History:  Procedure Laterality Date  . PACEMAKER IMPLANT N/A 03/11/2019   Procedure: PACEMAKER IMPLANT;  Surgeon: Thompson Grayer, MD;  Location: [place] [facility];  Service: Cardiovascular;  Laterality: N/A;  . TOTAL KNEE ARTHROPLASTY Left 11/21/2015   Procedure: LEFT TOTAL KNEE ARTHROPLASTY;  Surgeon: Gaynelle Arabian, MD;  Location: [facility];  Service: Orthopedics;  Laterality: Left;   Family History  Problem Relation Age of Onset  . Arthritis Mother   . Osteoporosis Mother   . Stroke Father   . Alzheimer's disease Sister     Social History   Tobacco Use  . Smoking status: Former Smoker    Years: 5.00  . Smokeless tobacco: Never Used  Substance Use  Topics  . Alcohol use: Yes    Comment: glass of wine daily    ROS  Review of Systems  Constitution: Negative for malaise/fatigue.  Cardiovascular: Positive for palpitations (brief, chronic). Negative for chest pain, dyspnea on exertion and leg swelling.  Gastrointestinal: Positive for heartburn. Negative for melena.   Objective  Blood pressure (!) 166/78, pulse 77, temperature 98 F (36.7 C), temperature source Temporal, resp. rate 16, height '5\' 5"'  (1.651 m), weight 124 lb 11.2 oz (56.6 kg), SpO2 99 %.  Vitals with BMI 05/06/2019 03/18/2019 03/18/2019  Height '5\' 5"'  - '5\' 5"'   Weight 124 lbs 11 oz - 125 lbs 13 oz  BMI 68.12 - 75.17  Systolic 001 749 449  Diastolic 78 88 81  Pulse 77 84 72     Physical Exam  Cardiovascular: Normal rate, regular rhythm, normal heart sounds and intact distal pulses. Exam reveals no gallop.  No murmur heard. No leg edema, no JVD.  Pulmonary/Chest: Effort normal and breath sounds normal.  Pacemaker/ICD site noted  in the left infraclavicular fossa.   Abdominal: Soft. Bowel sounds are normal.   Laboratory examination:   Recent Labs    03/10/19 2045 03/11/19 0137  NA 132* 136  K 4.2 3.7  CL 100 105  CO2 18* 20*  GLUCOSE 158* 118*  BUN 30* 30*  CREATININE 1.20* 1.01*  CALCIUM 8.8* 8.3*  GFRNONAA 41* 51*  GFRAA 48* 59*   CrCl cannot be calculated (Patient's most recent lab result is older than the maximum 21 days allowed.).  CMP Latest Ref Rng &  Units 03/11/2019 03/10/2019 11/23/2015  Glucose 70 - 99 mg/dL 118(H) 158(H) 96  BUN 8 - 23 mg/dL 30(H) 30(H) 7  Creatinine 0.44 - 1.00 mg/dL 1.01(H) 1.20(H) 0.49  Sodium 135 - 145 mmol/L 136 132(L) 140  Potassium 3.5 - 5.1 mmol/L 3.7 4.2 3.8  Chloride 98 - 111 mmol/L 105 100 107  CO2 22 - 32 mmol/L 20(L) 18(L) 26  Calcium 8.9 - 10.3 mg/dL 8.3(L) 8.8(L) 8.1(L)  Total Protein 6.5 - 8.1 g/dL 5.3(L) 6.4(L) -  Total Bilirubin 0.3 - 1.2 mg/dL 0.7 0.8 -  Alkaline Phos 38 - 126 U/L 50 60 -  AST 15 - 41 U/L  73(H) 104(H) -  ALT 0 - 44 U/L 91(H) 113(H) -   CBC Latest Ref Rng & Units 03/10/2019 11/23/2015 11/22/2015  WBC 4.0 - 10.5 K/uL 8.4 7.2 8.1  Hemoglobin 12.0 - 15.0 g/dL 14.2 10.6(L) 10.6(L)  Hematocrit 36.0 - 46.0 % 44.1 32.2(L) 32.1(L)  Platelets 150 - 400 K/uL 234 173 167   HEMOGLOBIN A1C Lab Results  Component Value Date   HGBA1C 5.7 (H) 03/11/2019   MPG 116.89 03/11/2019   TSH Recent Labs    03/10/19 2046  TSH 5.249*   External labs:   Lab 03/17/2019: Serum glucose 88 mg, BUN 13, creatinine 0.7, EGFR >60 mL.  Sodium 142, potassium 4.4, CMP otherwise normal.  Hb 13.8/HCT 44.5, platelets 175, normal indicis.  Total cholesterol 170, triglycerides 40, HDL 58, LDL 104.  Non-HDL cholesterol 112.  TSH 1.77, normal.  Medications and allergies  No Known Allergies   Current Outpatient Medications  Medication Instructions  . famotidine (PEPCID AC MAXIMUM STRENGTH) 20 mg, Oral, 2 times daily  . Multiple Vitamin (MULTIVITAMIN ADULT PO) 1 tablet, Oral, Daily  . norethindrone (AYGESTIN) 5 mg, Oral, See admin instructions, Take 5 mg by mouth once daily for 15 days each month  . Premarin 0.45 mg, Oral, Daily  . VITAMIN D PO 1 tablet, Oral, Daily   Radiology:   Chest x-ray PA and lateral view 03/11/2019: New pacing leads are in the right atrium and right ventricle. Negative for pneumothorax or acute disease.  Cardiac Studies:   Echocardiogram 03/11/2019:  1. Left ventricular ejection fraction, by estimation, is 55 to 60%. The  left ventricle has normal function. The left ventricle has no regional  wall motion abnormalities. Left ventricular diastolic parameters were normal.  2. Right ventricular systolic function is normal. The right ventricular  size is normal. There is normal pulmonary artery systolic pressure.  3. The mitral valve is normal in structure and function. Trivial mitral  valve regurgitation. Mild mitral annular calcification.  4. The inferior vena cava is  normal in size with <50% respiratory variability, suggesting right atrial pressure of 8 mmHg.  EKG:  EKG 03/18/2019: Sinus rhythm with first-degree AV block at the rate of 83 bpm, His bundle pacing noted.  Ventricularly paced.    Assessment     ICD-10-CM   1. White coat syndrome with high blood pressure but without hypertension  R03.0   2. Complete heart block (HCC)  I44.2   3. Pacemaker Dual chamber Medtronic Azure XT MRI conditional dual-chamber pacemaker with left bundle pacing 03/11/2019  Z95.0     No orders of the defined types were placed in this encounter.   There are no discontinued medications.   Recommendations:   Lindsay Frazier  is a 86 y.o. Caucasian female patient with no significant medical history, not on any medications except  Estrogen replacement,  Started noticing marked fatigue, shortness of breath and dizziness, admitted on 03/10/2019 with complete heart block, underwent successful pacemaker implantation with Medtronic dual-chamber pacemaker with His bundle pacing.    On clinic pacemaker check at [facility] she has had brief episode of atrial fibrillation for less than 1 to 2 minutes.  I will take over her pacemaker management, encouraged her to keep appointment with Dr. Thompson Grayer next month for a 57-monthvisit for evaluation of pacemaker function. We will monitor for atrial fibrillation given her age as risk factor along with elevated BP.  Although her blood pressure was elevated today here, she brings her home recordings, controlled with good correlation of the blood pressure cuffs at the MD office visit cups, blood pressure has been in the range of less than 130/73 mmHg average.  We will continue to monitor this closely. She does not want to start any medication and prefers holistic type medications for now.  Her labs from PCP were reviewed, renal function and LFTs have normalized since prior hospitalization.    JAdrian Prows MD, [facility]4/21/2021, 10:16 AM [facility] Office: [phone] CC Drs: JLenna Sciara Allred, R. Avva

## 2019-05-06 ENCOUNTER — Telehealth: Payer: Self-pay | Admitting: *Deleted

## 2019-05-06 ENCOUNTER — Other Ambulatory Visit: Payer: Self-pay

## 2019-05-06 ENCOUNTER — Encounter: Payer: Self-pay | Admitting: Cardiology

## 2019-05-06 ENCOUNTER — Ambulatory Visit: Payer: Medicare Other | Admitting: Cardiology

## 2019-05-06 VITALS — BP 166/78 | HR 77 | Temp 98.0°F | Resp 16 | Ht 65.0 in | Wt 124.7 lb

## 2019-05-06 DIAGNOSIS — Z950 Presence of cardiac pacemaker: Secondary | ICD-10-CM

## 2019-05-06 DIAGNOSIS — I442 Atrioventricular block, complete: Secondary | ICD-10-CM

## 2019-05-06 DIAGNOSIS — I10 Essential (primary) hypertension: Secondary | ICD-10-CM

## 2019-05-06 DIAGNOSIS — R030 Elevated blood-pressure reading, without diagnosis of hypertension: Secondary | ICD-10-CM

## 2019-05-06 NOTE — Telephone Encounter (Signed)
LMOM requesting call back to DC. Direct number provided.  Received request from Dr. Jacinto Halim to release patient's Carelink monitoring. Will confirm with patient prior to release.

## 2019-05-08 NOTE — Telephone Encounter (Signed)
Spoke with patient. She is agreeable to transferring remote monitoring to Dr. Verl Dicker office. Pt wishes to keep 91 day f/u with Dr. Johney Frame for now (scheduled on 06/22/19). Carelink released for enrollment by [facility]. Dr. Jacinto Halim made aware.

## 2019-06-19 ENCOUNTER — Telehealth: Payer: Self-pay

## 2019-06-19 NOTE — Telephone Encounter (Signed)
Spoke with pt regarding appt on 06/22/19. Pt was agreed to check vitals prior to appt. Pt questions was address.

## 2019-06-22 ENCOUNTER — Telehealth (INDEPENDENT_AMBULATORY_CARE_PROVIDER_SITE_OTHER): Payer: Medicare Other | Admitting: Internal Medicine

## 2019-06-22 ENCOUNTER — Encounter: Payer: Self-pay | Admitting: Internal Medicine

## 2019-06-22 ENCOUNTER — Other Ambulatory Visit: Payer: Self-pay

## 2019-06-22 VITALS — BP 125/74 | HR 71

## 2019-06-22 DIAGNOSIS — I119 Hypertensive heart disease without heart failure: Secondary | ICD-10-CM | POA: Diagnosis not present

## 2019-06-22 DIAGNOSIS — I442 Atrioventricular block, complete: Secondary | ICD-10-CM

## 2019-06-22 NOTE — Progress Notes (Addendum)
Electrophysiology TeleHealth Note  Due to national recommendations of social distancing due to COVID 19, an audio telehealth visit is felt to be most appropriate for this patient at this time.  Verbal consent was obtained by me for the telehealth visit today.  The patient does not have capability for a virtual visit.  A phone visit is therefore required today.   Date:  06/22/2019   ID:  Lindsay Frazier, DOB Jun 08, 1933, MRN [id number]  Location: patient's home  Provider location:  [place]  Evaluation Performed: Follow-up visit  PCP:  Chilton Greathouse, MD   Electrophysiologist:  Dr Johney Frame  Chief Complaint:  Pacemaker follow up  History of Present Illness:    Lindsay Frazier is a 86 y.o. female who presents via telehealth conferencing today.  Since last being seen in our clinic, the patient reports doing very well.  Today, she denies symptoms of palpitations, chest pain, shortness of breath,  lower extremity edema, dizziness, presyncope, or syncope.  The patient is otherwise without complaint today.     Past Medical History:  Diagnosis Date  . Arthritis   . Complete heart block (HCC) 03/10/2019  . Encounter for care of pacemaker 03/11/2019  . GERD (gastroesophageal reflux disease)   . Irregular heart beat   . Pacemaker Dual chamber Medtronic Azure XT MRI conditional dual-chamber pacemaker with left bundle pacing 03/11/2019 03/11/2019  . Yeast infection of the vagina    current on 11/14/2015 per patient     Past Surgical History:  Procedure Laterality Date  . PACEMAKER IMPLANT N/A 03/11/2019   Procedure: PACEMAKER IMPLANT;  Surgeon: Hillis Range, MD;  Location: [facility];  Service: Cardiovascular;  Laterality: N/A;  . TOTAL KNEE ARTHROPLASTY Left 11/21/2015   Procedure: LEFT TOTAL KNEE ARTHROPLASTY;  Surgeon: Ollen Gross, MD;  Location: [facility];  Service: Orthopedics;  Laterality: Left;    Current Outpatient Medications  Medication Sig Dispense Refill  .  famotidine (PEPCID AC MAXIMUM STRENGTH) 20 MG tablet Take 20 mg by mouth 2 (two) times daily.    . Multiple Vitamin (MULTIVITAMIN ADULT PO) Take 1 tablet by mouth daily.    . norethindrone (AYGESTIN) 5 MG tablet Take 5 mg by mouth See admin instructions. Take 5 mg by mouth once daily for 15 days each month    . PREMARIN 0.45 MG tablet Take 0.45 mg by mouth daily.    Marland Kitchen VITAMIN D PO Take 1 tablet by mouth daily.     No current facility-administered medications for this visit.    Allergies:   Patient has no known allergies.   Social History:  The patient  reports that she has quit smoking. She quit after 5.00 years of use. She has never used smokeless tobacco. She reports current alcohol use. She reports that she does not use drugs.   ROS:  Please see the history of present illness.   All other systems are personally reviewed and negative.    Exam:    Vital Signs:  BP 125/74   Pulse 71   Well sounding, alert and conversant   Labs/Other Tests and Data Reviewed:    Recent Labs: 03/10/2019: Hemoglobin 14.2; Magnesium 1.9; Platelets 234; TSH 5.249 03/11/2019: ALT 91; BUN 30; Creatinine, Ser 1.01; Potassium 3.7; Sodium 136   Wt Readings from Last 3 Encounters:  05/06/19 124 lb 11.2 oz (56.6 kg)  03/18/19 125 lb 12.8 oz (57.1 kg)  03/11/19 126 lb 11.2 oz (57.5 kg)  ASSESSMENT & PLAN:    1.  Complete heart block S/p PPM implant Her remotes are followed by Dr Einar Gip and I am not able to see device function currently. She understands that I'm not able to see her device interrogations at this time and that Dr Einar Gip will be responsible for device care.   2.  HTN Stable No change required today   Risks, benefits and potential toxicities for medications prescribed and/or refilled reviewed with patient today.   Follow-up:  Dr Einar Gip and me as needed   Patient Risk:  after full review of this patients clinical status, I feel that they are at moderate risk at this time.  Today,  I have spent 15 minutes with the patient with telehealth technology discussing arrhythmia management .    SignedThompson Grayer, MD  06/22/2019 1:21 PM     [place] [address] [address] [place] [address] [phone] (office) [phone] (fax)

## 2019-10-27 ENCOUNTER — Ambulatory Visit: Payer: Medicare Other | Attending: Internal Medicine

## 2019-10-27 DIAGNOSIS — Z23 Encounter for immunization: Secondary | ICD-10-CM

## 2019-10-27 NOTE — Progress Notes (Signed)
   Covid-19 Vaccination Clinic  Name:  KESA BIRKY    MRN: [id number] DOB: 1933/05/29  10/27/2019  Ms. Cassin was observed post Covid-19 immunization for 15 minutes without incident. She was provided with Vaccine Information Sheet and instruction to access the V-Safe system.   Ms. Meharg was instructed to call [phone] with any severe reactions post vaccine: Marland Kitchen Difficulty breathing  . Swelling of face and throat  . A fast heartbeat  . A bad rash all over body  . Dizziness and weakness

## 2019-11-11 ENCOUNTER — Ambulatory Visit: Payer: Medicare Other | Admitting: Cardiology

## 2019-11-11 ENCOUNTER — Encounter: Payer: Self-pay | Admitting: Cardiology

## 2019-11-11 ENCOUNTER — Other Ambulatory Visit: Payer: Self-pay

## 2019-11-11 VITALS — BP 130/83 | HR 74 | Resp 15 | Ht 65.0 in | Wt 122.0 lb

## 2019-11-11 DIAGNOSIS — I442 Atrioventricular block, complete: Secondary | ICD-10-CM

## 2019-11-11 DIAGNOSIS — R0989 Other specified symptoms and signs involving the circulatory and respiratory systems: Secondary | ICD-10-CM

## 2019-11-11 DIAGNOSIS — Z950 Presence of cardiac pacemaker: Secondary | ICD-10-CM

## 2019-11-11 NOTE — Progress Notes (Signed)
Primary Physician/Referring:  Prince Solian, MD  Patient ID: Lindsay Frazier, female    DOB: [birth year], 86 y.o.   MRN: [id number]  Chief Complaint  Patient presents with  . Follow-up    6 month  . Hypertension  . Heart Block   HPI:    Lindsay Frazier  is a 86 y.o. Caucasian female patient with no significant medical history, not on any medications except  Estrogen replacement, Started noticing marked fatigue, shortness of breath and dizziness, admitted on 03/10/2019 with complete heart block, underwent successful pacemaker implantation with Medtronic dual-chamber pacemaker with His bundle pacing.    She prefers holistic type medicines.  She has recuperated well, has resumed all of activity and has not had any further dizziness or syncope or fatigue. She c/o heart burn especially at night and improved with PPI.   Past Medical History:  Diagnosis Date  . Arthritis   . Complete heart block ([place]) 03/10/2019  . Encounter for care of pacemaker 03/11/2019  . GERD (gastroesophageal reflux disease)   . Irregular heart beat   . Pacemaker Dual chamber Medtronic Azure XT MRI conditional dual-chamber pacemaker with left bundle pacing 03/11/2019 03/11/2019  . Yeast infection of the vagina    current on 11/14/2015 per patient    Past Surgical History:  Procedure Laterality Date  . PACEMAKER IMPLANT N/A 03/11/2019   Procedure: PACEMAKER IMPLANT;  Surgeon: Thompson Grayer, MD;  Location: [facility];  Service: Cardiovascular;  Laterality: N/A;  . TOTAL KNEE ARTHROPLASTY Left 11/21/2015   Procedure: LEFT TOTAL KNEE ARTHROPLASTY;  Surgeon: Gaynelle Arabian, MD;  Location: [facility];  Service: Orthopedics;  Laterality: Left;   Family History  Problem Relation Age of Onset  . Arthritis Mother   . Osteoporosis Mother   . Stroke Father   . Alzheimer's disease Sister     Social History   Tobacco Use  . Smoking status: Never Smoker  . Smokeless tobacco: Never Used  Substance Use Topics  .  Alcohol use: Yes    Alcohol/week: 4.0 standard drinks    Types: 4 Glasses of wine per week    Comment: weekly   ROS  Review of Systems  Constitutional: Negative for malaise/fatigue.  Cardiovascular: Negative for chest pain, dyspnea on exertion, leg swelling and palpitations.  Gastrointestinal: Positive for heartburn. Negative for melena.   Objective  Blood pressure 130/83, pulse 74, resp. rate 15, height _0  (1.651 m), weight 122 lb (55.3 kg), SpO2 99 %.  Vitals with BMI 11/11/2019 06/22/2019 05/06/2019  Height _1  - _2   Weight 122 lbs - 124 lbs 11 oz  BMI 93.8 - 10.17  Systolic 510 258 527  Diastolic 83 74 78  Pulse 74 71 77     Physical Exam Constitutional:      Appearance: She is underweight.  Cardiovascular:     Rate and Rhythm: Normal rate and regular rhythm.     Pulses: Intact distal pulses.     Heart sounds: Normal heart sounds. No murmur heard.  No gallop.      Comments: No leg edema, no JVD. Pulmonary:     Effort: Pulmonary effort is normal.     Breath sounds: Normal breath sounds.     Comments: Pacemaker/ICD site noted  in the left infraclavicular fossa.   Abdominal:     General: Bowel sounds are normal.     Palpations: Abdomen is soft.    Laboratory examination:   Recent Labs    03/10/19  2045 03/11/19 0137  NA 132* 136  K 4.2 3.7  CL 100 105  CO2 18* 20*  GLUCOSE 158* 118*  BUN 30* 30*  CREATININE 1.20* 1.01*  CALCIUM 8.8* 8.3*  GFRNONAA 41* 51*  GFRAA 48* 59*   CrCl cannot be calculated (Patient's most recent lab result is older than the maximum 21 days allowed.).  CMP Latest Ref Rng & Units 03/11/2019 03/10/2019 11/23/2015  Glucose 70 - 99 mg/dL 118(H) 158(H) 96  BUN 8 - 23 mg/dL 30(H) 30(H) 7  Creatinine 0.44 - 1.00 mg/dL 1.01(H) 1.20(H) 0.49  Sodium 135 - 145 mmol/L 136 132(L) 140  Potassium 3.5 - 5.1 mmol/L 3.7 4.2 3.8  Chloride 98 - 111 mmol/L 105 100 107  CO2 22 - 32 mmol/L 20(L) 18(L) 26  Calcium 8.9 - 10.3 mg/dL 8.3(L) 8.8(L)  8.1(L)  Total Protein 6.5 - 8.1 g/dL 5.3(L) 6.4(L) -  Total Bilirubin 0.3 - 1.2 mg/dL 0.7 0.8 -  Alkaline Phos 38 - 126 U/L 50 60 -  AST 15 - 41 U/L 73(H) 104(H) -  ALT 0 - 44 U/L 91(H) 113(H) -   CBC Latest Ref Rng & Units 03/10/2019 11/23/2015 11/22/2015  WBC 4.0 - 10.5 K/uL 8.4 7.2 8.1  Hemoglobin 12.0 - 15.0 g/dL 14.2 10.6(L) 10.6(L)  Hematocrit 36 - 46 % 44.1 32.2(L) 32.1(L)  Platelets 150 - 400 K/uL 234 173 167   HEMOGLOBIN A1C Lab Results  Component Value Date   HGBA1C 5.7 (H) 03/11/2019   MPG 116.89 03/11/2019   TSH Recent Labs    03/10/19 2046  TSH 5.249*   External labs:   Lab 03/17/2019: Serum glucose 88 mg, BUN 13, creatinine 0.7, EGFR >60 mL.  Sodium 142, potassium 4.4, CMP otherwise normal.  Hb 13.8/HCT 44.5, platelets 175, normal indicis.  Total cholesterol 170, triglycerides 40, HDL 58, LDL 104.  Non-HDL cholesterol 112.  TSH 1.77, normal.  Medications and allergies  No Known Allergies   Current Outpatient Medications  Medication Instructions  . CALCIUM-MAG-VIT C-VIT D PO Oral  . famotidine (PEPCID AC MAXIMUM STRENGTH) 20 mg, Oral, 2 times daily  . Multiple Vitamin (MULTIVITAMIN ADULT PO) 1 tablet, Oral, Daily  . norethindrone (AYGESTIN) 5 mg, Oral, See admin instructions, Take 5 mg by mouth once daily for 15 days each month  . Premarin 0.45 mg, Oral, Daily  . VITAMIN D PO 1 tablet, Oral, Daily   Radiology:   Chest x-ray PA and lateral view 03/11/2019: New pacing leads are in the right atrium and right ventricle. Negative for pneumothorax or acute disease.  Cardiac Studies:   Echocardiogram 03/11/2019:  1. Left ventricular ejection fraction, by estimation, is 55 to 60%. The  left ventricle has normal function. The left ventricle has no regional  wall motion abnormalities. Left ventricular diastolic parameters were normal.  2. Right ventricular systolic function is normal. The right ventricular  size is normal. There is normal pulmonary artery  systolic pressure.  3. The m itral valve is normal in structure and function. Trivial mitral  valve regurgitation. Mild mitral annular calcification.  4. The inferior vena cava is normal in size with <50% respiratory variability, suggesting right atrial pressure of 8 mmHg.  EKG:  EKG 11/11/2019: Normal sinus rhythm at rate of 65 bpm, ventricularly paced rhythm.  Suspect left bundle pacing or His bundle pacing.    EKG 03/18/2019: Sinus rhythm with first-degree AV block at the rate of 83 bpm, His bundle pacing noted.  Ventricularly paced.  Assessment     ICD-10-CM   1. Complete heart block (HCC)  I44.2 EKG 12-Lead  2. Pacemaker Dual chamber Medtronic Azure XT MRI conditional dual-chamber pacemaker with left bundle pacing 03/11/2019  Z95.0   3. Labile hypertension  R09.89     No orders of the defined types were placed in this encounter.   There are no discontinued medications.   Recommendations:   Lindsay Frazier  is a 86 y.o. Caucasian female patient with no significant medical history, not on any medications except  Estrogen replacement, Started noticing marked fatigue, shortness of breath and dizziness, admitted on 03/10/2019 with complete heart block, underwent successful pacemaker implantation with Medtronic dual-chamber pacemaker with His bundle pacing.   She is presently doing well, except for GERD no other specific symptoms.  Physical examination is unremarkable.  Blood pressure is also minimally elevated especially diastolic.  No changes in the medications were done today.  I will set her up for a pacemaker check as she has not had chronic set up since implantation.  I will also enroll her in remote monitoring.  I will see her back in 6 months for follow-up.   Adrian Prows, MD, [facility] 11/11/2019, 10:54 AM Office: [phone] Pager: [phone]

## 2019-11-25 ENCOUNTER — Encounter: Payer: Medicare Other | Admitting: Cardiology

## 2019-11-30 ENCOUNTER — Telehealth: Payer: Self-pay | Admitting: Cardiology

## 2019-12-01 NOTE — Telephone Encounter (Signed)
Phone number on file is disconnected

## 2020-05-11 ENCOUNTER — Other Ambulatory Visit: Payer: Self-pay

## 2020-05-11 ENCOUNTER — Ambulatory Visit: Payer: Medicare Other | Admitting: Cardiology

## 2020-05-11 ENCOUNTER — Encounter: Payer: Self-pay | Admitting: Cardiology

## 2020-05-11 VITALS — BP 164/86 | HR 78 | Temp 98.2°F | Resp 17 | Ht 65.0 in | Wt 129.8 lb

## 2020-05-11 DIAGNOSIS — R030 Elevated blood-pressure reading, without diagnosis of hypertension: Secondary | ICD-10-CM

## 2020-05-11 DIAGNOSIS — I442 Atrioventricular block, complete: Secondary | ICD-10-CM

## 2020-05-11 DIAGNOSIS — R0989 Other specified symptoms and signs involving the circulatory and respiratory systems: Secondary | ICD-10-CM

## 2020-05-11 DIAGNOSIS — Z950 Presence of cardiac pacemaker: Secondary | ICD-10-CM

## 2020-05-11 NOTE — Progress Notes (Signed)
Primary Physician/Referring:  Prince Solian, MD  Patient ID: Lindsay Frazier, female    DOB: 12-May-1933, 87 y.o.   MRN: [id number]  No chief complaint on file.  HPI:    Lindsay Frazier  is a 87 y.o. Caucasian female patient with no significant medical history, not on any medications except  Estrogen replacement, Started noticing marked fatigue, shortness of breath and dizziness, admitted on 03/10/2019 with complete heart block, underwent successful pacemaker implantation with Medtronic dual-chamber pacemaker with His bundle pacing.    She prefers holistic type medicines.  She c/o heart burn especially at night and improved with PPI. 2 weeks ago had mild fever and fatigue. Also had constant chest pain on the right and right shoulder pain improved with naprosyn.  Past Medical History:  Diagnosis Date  . Arthritis   . Complete heart block ([place]) 03/10/2019  . Encounter for care of pacemaker 03/11/2019  . GERD (gastroesophageal reflux disease)   . Irregular heart beat   . Pacemaker Dual chamber Medtronic Azure XT MRI conditional dual-chamber pacemaker with left bundle pacing 03/11/2019 03/11/2019  . Yeast infection of the vagina    current on 11/14/2015 per patient    Past Surgical History:  Procedure Laterality Date  . PACEMAKER IMPLANT N/A 03/11/2019   Procedure: PACEMAKER IMPLANT;  Surgeon: Thompson Grayer, MD;  Location: [place] [facility];  Service: Cardiovascular;  Laterality: N/A;  . TOTAL KNEE ARTHROPLASTY Left 11/21/2015   Procedure: LEFT TOTAL KNEE ARTHROPLASTY;  Surgeon: Gaynelle Arabian, MD;  Location: [facility];  Service: Orthopedics;  Laterality: Left;   Family History  Problem Relation Age of Onset  . Arthritis Mother   . Osteoporosis Mother   . Stroke Father   . Alzheimer's disease Sister     Social History   Tobacco Use  . Smoking status: Never Smoker  . Smokeless tobacco: Never Used  Substance Use Topics  . Alcohol use: Yes    Alcohol/week: 4.0 standard drinks     Types: 4 Glasses of wine per week    Comment: weekly   ROS  Review of Systems  Constitutional: Negative for malaise/fatigue.  Cardiovascular: Negative for chest pain, dyspnea on exertion, leg swelling and palpitations.  Gastrointestinal: Positive for heartburn. Negative for melena.   Objective  There were no vitals taken for this visit.  Vitals with BMI 11/11/2019 06/22/2019 05/06/2019  Height '5\' 5"'  - '5\' 5"'   Weight 122 lbs - 124 lbs 11 oz  BMI 28.3 - 15.17  Systolic 616 073 710  Diastolic 83 74 78  Pulse 74 71 77     Physical Exam Constitutional:      Appearance: She is underweight.  Cardiovascular:     Rate and Rhythm: Normal rate and regular rhythm.     Pulses: Intact distal pulses.     Heart sounds: Normal heart sounds. No murmur heard. No gallop.      Comments: No leg edema, no JVD. Pulmonary:     Effort: Pulmonary effort is normal.     Breath sounds: Normal breath sounds.     Comments: Pacemaker/ICD site noted  in the left infraclavicular fossa.   Abdominal:     General: Bowel sounds are normal.     Palpations: Abdomen is soft.    Laboratory examination:   No results for input(s): NA, K, CL, CO2, GLUCOSE, BUN, CREATININE, CALCIUM, GFRNONAA, GFRAA in the last 8760 hours. CrCl cannot be calculated (Patient's most recent lab result is older than the maximum 21 days  allowed.).  CMP Latest Ref Rng & Units 03/11/2019 03/10/2019 11/23/2015  Glucose 70 - 99 mg/dL 118(H) 158(H) 96  BUN 8 - 23 mg/dL 30(H) 30(H) 7  Creatinine 0.44 - 1.00 mg/dL 1.01(H) 1.20(H) 0.49  Sodium 135 - 145 mmol/L 136 132(L) 140  Potassium 3.5 - 5.1 mmol/L 3.7 4.2 3.8  Chloride 98 - 111 mmol/L 105 100 107  CO2 22 - 32 mmol/L 20(L) 18(L) 26  Calcium 8.9 - 10.3 mg/dL 8.3(L) 8.8(L) 8.1(L)  Total Protein 6.5 - 8.1 g/dL 5.3(L) 6.4(L) -  Total Bilirubin 0.3 - 1.2 mg/dL 0.7 0.8 -  Alkaline Phos 38 - 126 U/L 50 60 -  AST 15 - 41 U/L 73(H) 104(H) -  ALT 0 - 44 U/L 91(H) 113(H) -   CBC Latest Ref Rng & Units  03/10/2019 11/23/2015 11/22/2015  WBC 4.0 - 10.5 K/uL 8.4 7.2 8.1  Hemoglobin 12.0 - 15.0 g/dL 14.2 10.6(L) 10.6(L)  Hematocrit 36.0 - 46.0 % 44.1 32.2(L) 32.1(L)  Platelets 150 - 400 K/uL 234 173 167   HEMOGLOBIN A1C Lab Results  Component Value Date   HGBA1C 5.7 (H) 03/11/2019   MPG 116.89 03/11/2019   External labs:    04/14/2020:  Serum glucose 94 mg, BUN 13, creatinine 0.7, EGFR 79 mL.  Hb 14.2/HCT 43.1, WBC 5.11, platelets 205.  TSH normal at 2.42, normal free T4  Lab 03/17/2019: Serum glucose 88 mg, BUN 13, creatinine 0.7, EGFR >60 mL.  Sodium 142, potassium 4.4, CMP otherwise normal.  Hb 13.8/HCT 44.5, platelets 175, normal indicis.  Total cholesterol 170, triglycerides 40, HDL 58, LDL 104.  Non-HDL cholesterol 112.  TSH 1.77, normal.  Medications and allergies  No Known Allergies   Current Outpatient Medications  Medication Instructions  . CALCIUM-MAG-VIT C-VIT D PO Oral  . famotidine (PEPCID AC MAXIMUM STRENGTH) 20 mg, Oral, 2 times daily  . Multiple Vitamin (MULTIVITAMIN ADULT PO) 1 tablet, Oral, Daily  . norethindrone (AYGESTIN) 5 mg, Oral, See admin instructions, Take 5 mg by mouth once daily for 15 days each month  . Premarin 0.45 mg, Oral, Daily  . VITAMIN D PO 1 tablet, Oral, Daily   Radiology:   Chest x-ray PA and lateral view 03/11/2019: New pacing leads are in the right atrium and right ventricle. Negative for pneumothorax or acute disease.  Cardiac Studies:   Echocardiogram 03/11/2019:  1. Left ventricular ejection fraction, by estimation, is 55 to 60%. The  left ventricle has normal function. The left ventricle has no regional  wall motion abnormalities. Left ventricular diastolic parameters were normal.  2. Right ventricular systolic function is normal. The right ventricular  size is normal. There is normal pulmonary artery systolic pressure.  3. The m itral valve is normal in structure and function. Trivial mitral  valve regurgitation.  Mild mitral annular calcification.  4. The inferior vena cava is normal in size with <50% respiratory variability, suggesting right atrial pressure of 8 mmHg.  Device Clinic: Pacemaker Dual chamber Medtronic Azure XT MRI conditional dual-chamber pacemaker with left bundle pacing 03/11/2019  Scheduled Remote pacemaker check  02/22/2020: 4 atrial high rate episodes.  Longest 3 minutes.  EGM demonstrates AT.  Atrial arrhythmia burden <1%.  1 HVR episodes = 8 beat NSVT.  Normal pacemaker function. Battery longevity is 10.2 years. RA pacing is 2.9 %, RV pacing is 99.8 %.  EKG:   EKG 05/11/2020: Sinus rhythm with first-degree AV block at rate of 70 bpm, ventricularly paced rhythm.  No further analysis.  His bundle pacing detected.   No significant change from EKG 11/11/2019   Assessment     ICD-10-CM   1. Labile hypertension  R09.89   2. Complete heart block (HCC)  I44.2   3. Pacemaker Dual chamber Medtronic Azure XT MRI conditional dual-chamber pacemaker with left bundle pacing 03/11/2019  Z95.0     No orders of the defined types were placed in this encounter.   There are no discontinued medications.   Recommendations:   GAYATHRI FUTRELL  is a 87 y.o. Caucasian female patient with no significant medical history, not on any medications except  Estrogen replacement, Started noticing marked fatigue, shortness of breath and dizziness, admitted on 03/10/2019 with complete heart block, underwent successful pacemaker implantation with Medtronic dual-chamber pacemaker with His bundle pacing.   She is presently doing well, except for GERD no other specific symptoms.  Physical examination is unremarkable.  Blood pressure is elevated today, however on review of records from PCP,  blood pressure was normal. BP recordings from home are normal. She will continue to monitor.  Reviewed her pacemaker transmission. Pacemaker is functioning normally. Will see her back in 1 year with pacer check.  I reviewed her  labs from PCP. Normal renal function and CBC normal.    Adrian Prows, MD, [facility] 05/11/2020, 1:07 PM Office: [phone] Pager: [phone]

## 2020-05-12 ENCOUNTER — Telehealth: Payer: Self-pay | Admitting: Cardiology

## 2020-05-12 DIAGNOSIS — I442 Atrioventricular block, complete: Secondary | ICD-10-CM

## 2020-05-12 DIAGNOSIS — Z950 Presence of cardiac pacemaker: Secondary | ICD-10-CM

## 2020-05-12 DIAGNOSIS — Z45018 Encounter for adjustment and management of other part of cardiac pacemaker: Secondary | ICD-10-CM

## 2020-05-12 NOTE — Telephone Encounter (Signed)
    ICD-10-CM   1. Encounter for care of pacemaker  Z45.018   2. Complete heart block (HCC)  I44.2   3. Pacemaker Dual chamber Medtronic Azure XT MRI conditional dual-chamber pacemaker with left bundle pacing 03/11/2019  Z95.0      Scheduled  In office  pacemaker check 05/12/2020: Single (S)/Dual (D)/BV: d. Presenting asvp. Pacemaker dependant:  Yes. Underlying Complete heart block. AP 5.3%, VP 99.7%. AMS Episodes 8. Last Dec 2021: Brief 2:1 A. FL for <3 min.  AT/AF burden <1% . HVR 1. 5 beat NSVT. Longevity >10 Years. Magnet rate: >85%. Lead measurements: Stable. Histogram: Low (L)/normal (N)/high (H)  normal. Patient activity Good.   Observations: Normal pacemaker function. Changes: Reset time to EST, current.   Normal pacemaker function. Pacer dependant. Occasional palpitations due to rare PVC. No AF/FL episodes since Dec 2021 (brief).   [name], MD, [facility] 05/12/2020, 9:33 AM Office: [phone] Pager: [phone]

## 2020-05-27 ENCOUNTER — Telehealth: Payer: Self-pay

## 2020-05-27 DIAGNOSIS — Z950 Presence of cardiac pacemaker: Secondary | ICD-10-CM

## 2020-05-27 DIAGNOSIS — R002 Palpitations: Secondary | ICD-10-CM

## 2020-05-27 NOTE — Telephone Encounter (Signed)
ICD-10-CM   1. Palpitations  R00.2   2. Pacemaker Dual chamber Medtronic Azure XT MRI conditional dual-chamber pacemaker with left bundle pacing 03/11/2019  Z95.0     Schedule remote dual-chamber pacemaker transmission 05/23/2020: Longevity 10 years. AP 8%, VP 97%.  There was no mode switch, no high ventricular rate episodes.  Normal pacemaker function.  Patient called [place] regarding palpitations, review of her pacemaker reveals no significant sustained palpitations.  She has had brief atrial tachycardia in the past.  I advised her that we could certainly try low-dose of a beta-blocker or calcium channel blocker to improve her symptoms.  Patient just wanted to be reassured and she will call us back if she decides to try the medication.  There is no atrial fibrillation on remote pacemaker transmission.  Pacemaker is functioning normally.   [name], MD, [facility] 05/27/2020, 2:23 PM Office: [phone] Fax: [phone] Pager: [phone]

## 2021-05-10 IMAGING — DX DG CHEST 2V
2 series · 2 of 2 positions shown · non-contrast
Comparison: Single-view of the chest 03/10/2019.

CLINICAL DATA: Status post pacemaker placement today.

EXAM:
CHEST - 2 VIEW

[chest lat]
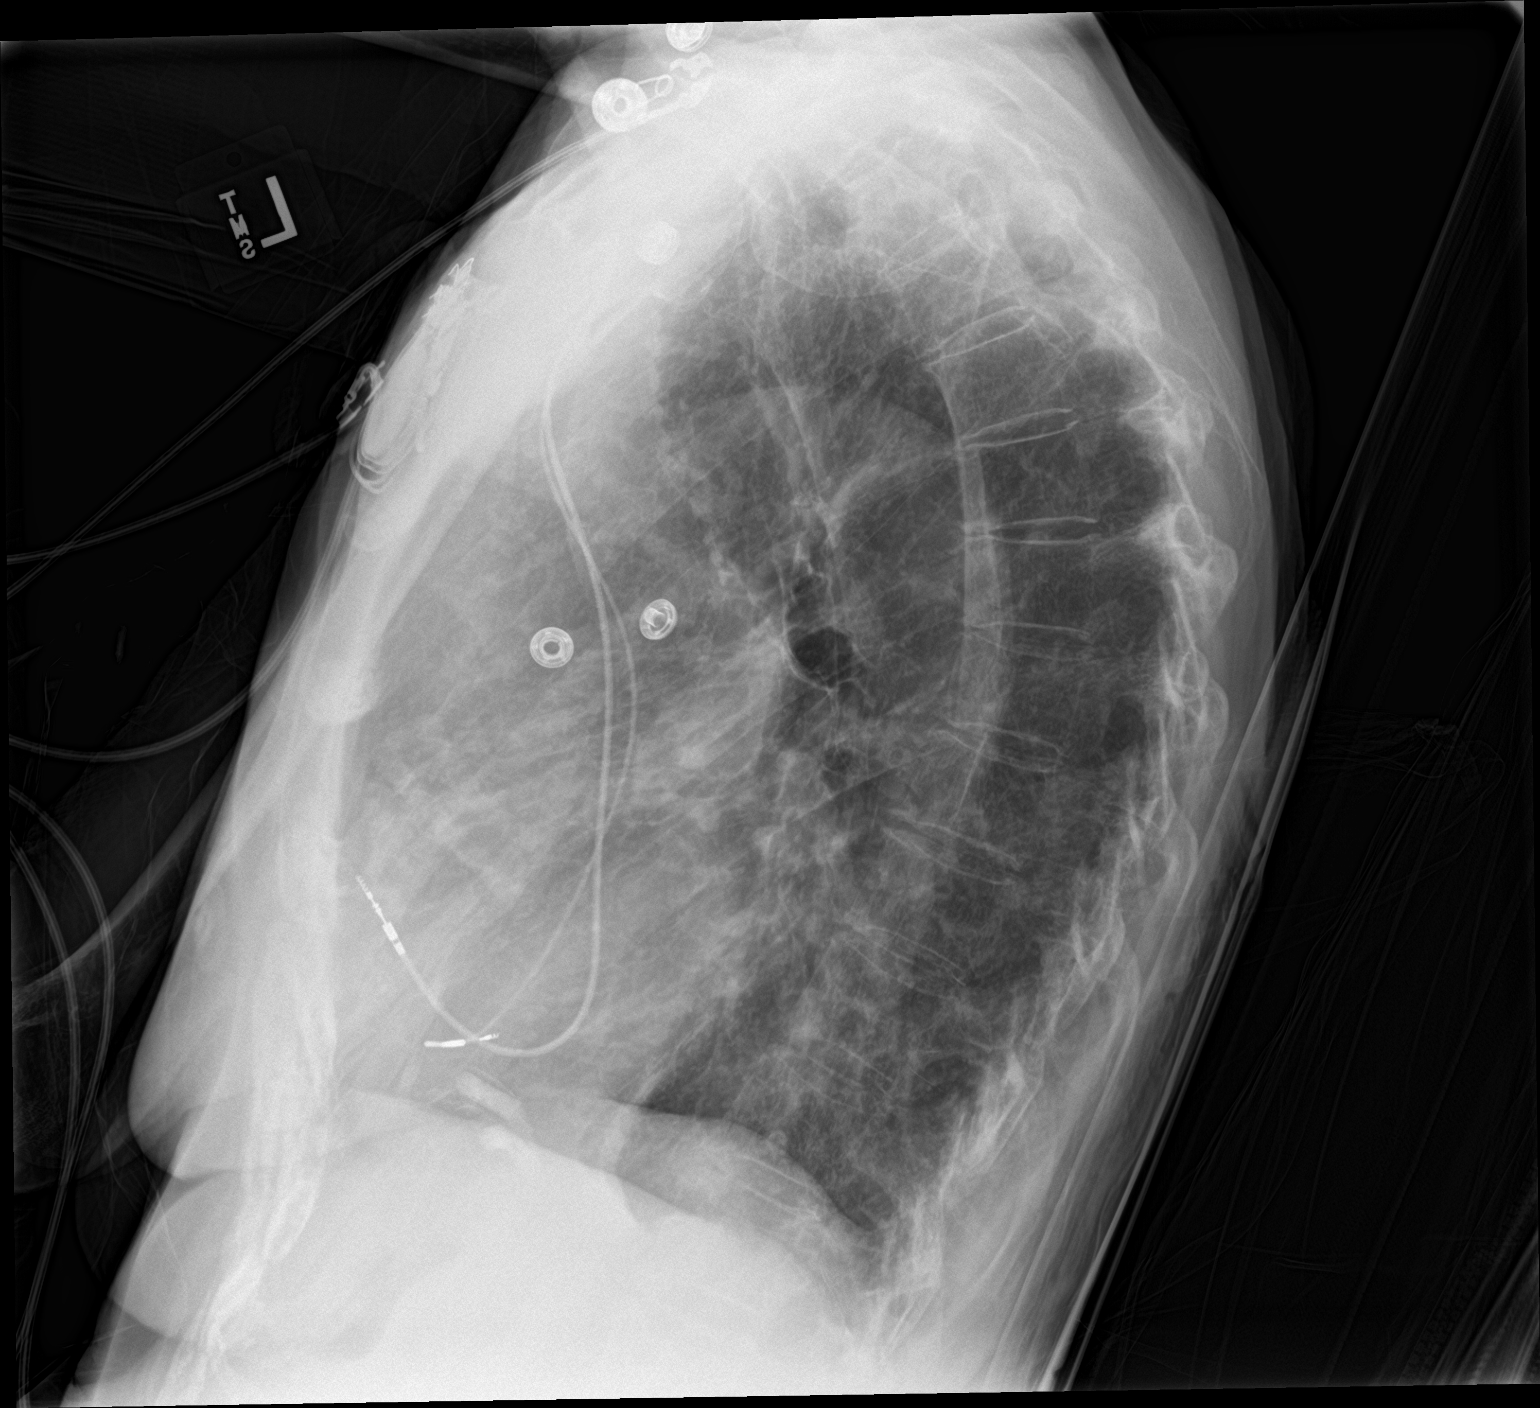

[chest ap]
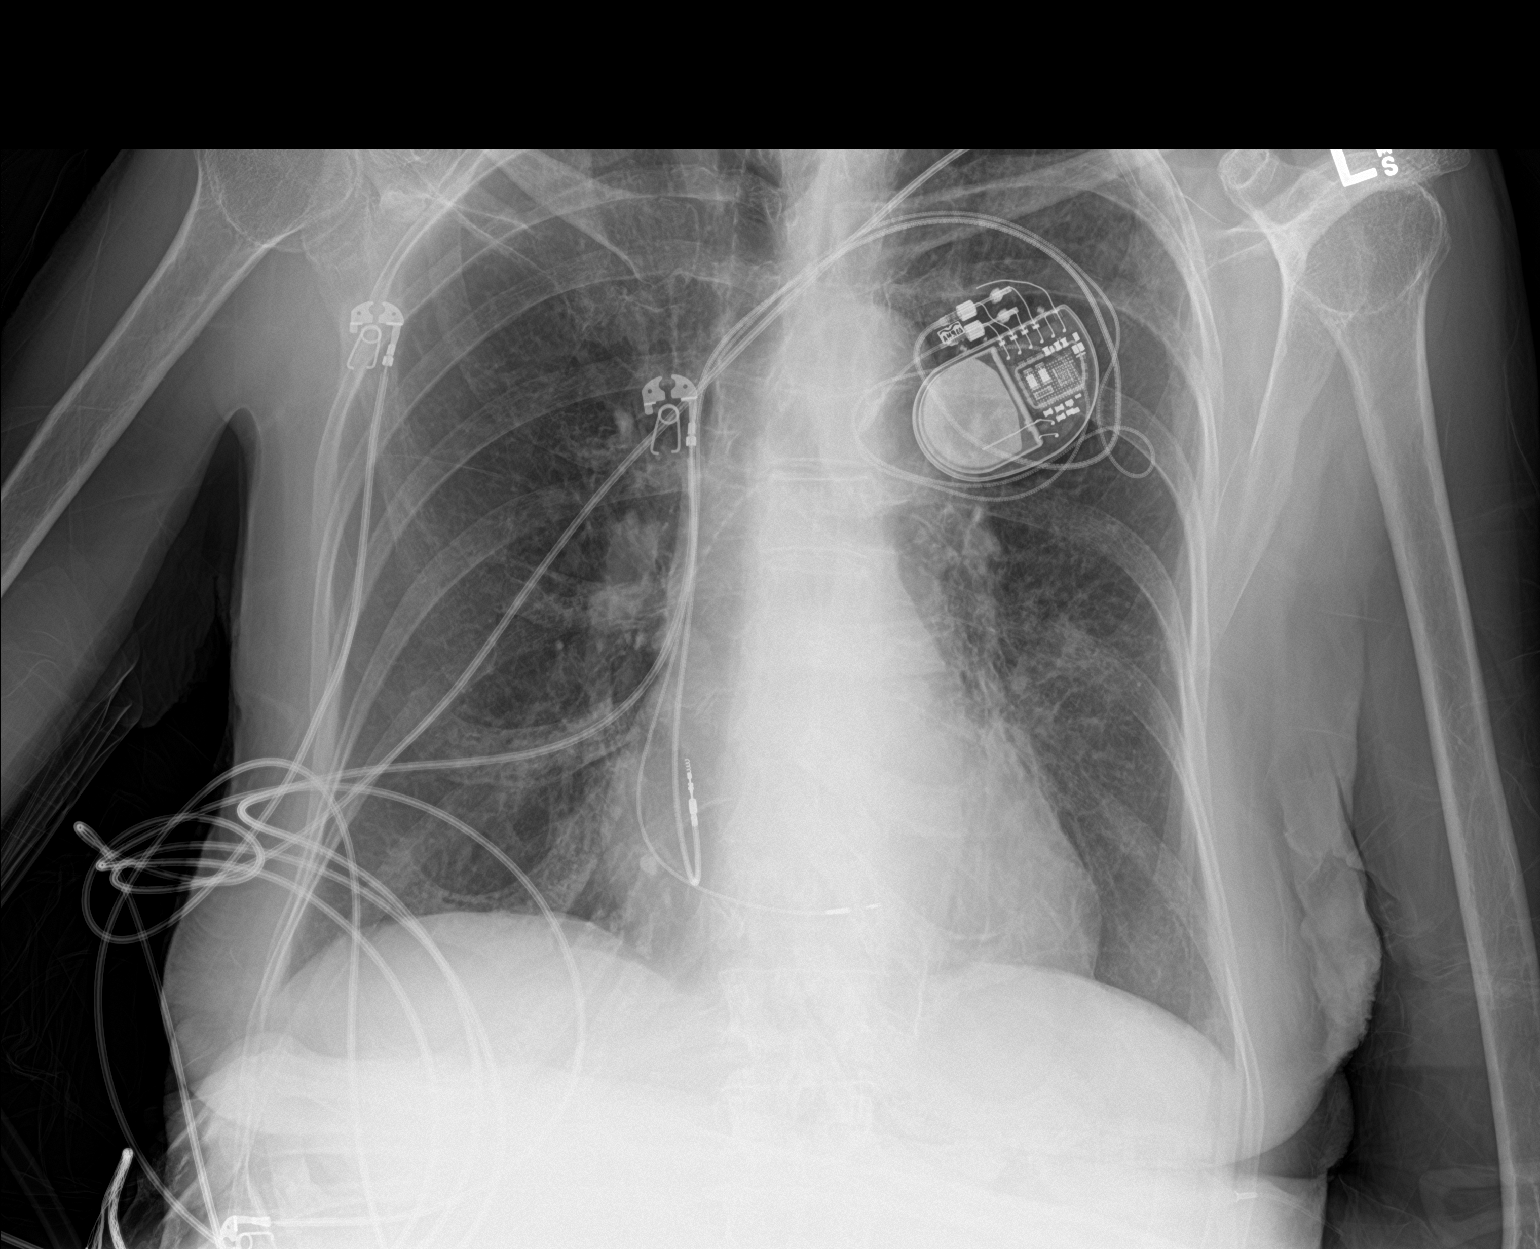

[2 of 2 positions shown; findings below may reference images not displayed]

FINDINGS: New 2 lead pacing device is in place. Lead tips are in the right
atrium and right ventricle. No pneumothorax. Lungs are clear. Heart
size is upper normal. No acute or focal bony abnormality.
IMPRESSION: New pacing leads are in the right atrium and right ventricle.
Negative for pneumothorax or acute disease.

## 2021-05-11 ENCOUNTER — Encounter: Payer: Self-pay | Admitting: Cardiology

## 2021-05-11 ENCOUNTER — Ambulatory Visit: Payer: Medicare Other | Admitting: Cardiology

## 2021-05-11 VITALS — BP 161/80 | HR 67 | Temp 98.2°F | Resp 18 | Ht 65.0 in | Wt 129.2 lb

## 2021-05-11 DIAGNOSIS — Z950 Presence of cardiac pacemaker: Secondary | ICD-10-CM

## 2021-05-11 DIAGNOSIS — I10 Essential (primary) hypertension: Secondary | ICD-10-CM

## 2021-05-11 DIAGNOSIS — R030 Elevated blood-pressure reading, without diagnosis of hypertension: Secondary | ICD-10-CM

## 2021-05-11 DIAGNOSIS — I442 Atrioventricular block, complete: Secondary | ICD-10-CM

## 2021-05-11 DIAGNOSIS — Z45018 Encounter for adjustment and management of other part of cardiac pacemaker: Secondary | ICD-10-CM

## 2021-05-11 NOTE — Progress Notes (Signed)
? ?Primary Physician/Referring:  Prince Solian, MD ? ?Patient ID: Lindsay Frazier, female    DOB: 08-14-1933, 88 y.o.   MRN: [id number] ? ?Chief Complaint  ?Patient presents with  ? Complete Heart Block  ? Pacemaker Check  ?  Medtronic  ? Follow-up  ?  1 year  ? ? ?HPI:   ? ?Lindsay Frazier  is a 88 y.o. Caucasian female patient with no significant medical history, not on any medications except  Estrogen replacement, Started noticing marked fatigue, shortness of breath and dizziness, admitted on 03/10/2019 with complete heart block, underwent successful pacemaker implantation with Medtronic dual-chamber pacemaker with His bundle pacing.   ? ?She prefers holistic type medicines.  She c/o heart burn especially at night and improved with PPI. 2 weeks ago had mild fever and fatigue. Also had constant chest pain on the right and right shoulder pain improved with naprosyn. ? ?Past Medical History:  ?Diagnosis Date  ? Arthritis   ? Complete heart block ([facility]) 03/10/2019  ? Encounter for care of pacemaker 03/11/2019  ? GERD (gastroesophageal reflux disease)   ? Irregular heart beat   ? Pacemaker Dual chamber Medtronic Azure XT MRI conditional dual-chamber pacemaker with left bundle pacing 03/11/2019 03/11/2019  ? Yeast infection of the vagina   ? current on 11/14/2015 per patient   ? ?Past Surgical History:  ?Procedure Laterality Date  ? PACEMAKER IMPLANT N/A 03/11/2019  ? Procedure: PACEMAKER IMPLANT;  Surgeon: Thompson Grayer, MD;  Location: [facility];  Service: Cardiovascular;  Laterality: N/A;  ? TOTAL KNEE ARTHROPLASTY Left 11/21/2015  ? Procedure: LEFT TOTAL KNEE ARTHROPLASTY;  Surgeon: Gaynelle Arabian, MD;  Location: [facility];  Service: Orthopedics;  Laterality: Left;  ? ?Family History  ?Problem Relation Age of Onset  ? Arthritis Mother   ? Osteoporosis Mother   ? Stroke Father   ? Alzheimer's disease Sister   ?  ?Social History  ? ?Tobacco Use  ? Smoking status: Former  ?  Packs/day: 0.25  ?  Years: 5.00  ?  Pack  years: 1.25  ?  Types: Cigarettes  ? Smokeless tobacco: Never  ? Tobacco comments:  ?  Briefly in college.  ?Substance Use Topics  ? Alcohol use: Yes  ?  Alcohol/week: 4.0 standard drinks  ?  Types: 4 Glasses of wine per week  ?  Comment: weekly  ? ?ROS  ?Review of Systems  ?Constitutional: Negative for malaise/fatigue.  ?Cardiovascular:  Negative for chest pain, dyspnea on exertion, leg swelling and palpitations.  ?Gastrointestinal:  Positive for heartburn. Negative for melena.  ?Objective  ?Blood pressure (!) 161/80, pulse 67, temperature 98.2 ?F (36.8 ?C), temperature source Temporal, resp. rate 18, height _0  (1.651 m), weight 129 lb 3.2 oz (58.6 kg), SpO2 98 %.  ? ?  05/11/2021  ?  1:41 PM 05/11/2021  ?  1:40 PM 05/11/2020  ?  1:18 PM  ?Vitals with BMI  ?Height  _1    ?Weight  129 lbs 3 oz   ?BMI  21.5   ?Systolic 834 196 222  ?Diastolic 80 95 86  ?Pulse 67 73 78  ?  ? Physical Exam ?Constitutional:   ?   Appearance: She is underweight.  ?Cardiovascular:  ?   Rate and Rhythm: Normal rate and regular rhythm.  ?   Pulses: Intact distal pulses.  ?   Heart sounds: Normal heart sounds. No murmur heard. ?  No gallop.  ?   Comments: No leg edema, no  JVD. ?Pulmonary:  ?   Effort: Pulmonary effort is normal.  ?   Breath sounds: Normal breath sounds.  ?   Comments: Pacemaker/ICD site noted  in the left infraclavicular fossa.   ?Abdominal:  ?   General: Bowel sounds are normal.  ?   Palpations: Abdomen is soft.  ? ?Laboratory examination:  ? ? ?External labs:  ? ?Cholesterol, total 201.000 m 05/02/2021 ?HDL 52.000 mg 05/02/2021 ?LDL 136.000 m 05/02/2021 ?Triglycerides 65.000 mg 05/02/2021 ? ?A1C 5.700 % 03/11/2019 ? ?Hemoglobin 13.800 g/d 03/19/2019 ? ?Creatinine, Serum 0.700 mg/ 03/19/2019 ?Potassium 4.100 mEq 04/14/2020 ?ALT (SGPT) 20.000 IU/ 04/14/2020 ? ?TSH 2.670 05/02/2021 ? ?04/14/2020: ? ?Serum glucose 94 mg, BUN 13, creatinine 0.7, EGFR 79 mL. ? ?Hb 14.2/HCT 43.1, WBC 5.11, platelets 205. ? ?TSH normal at 2.42, normal free  T4 ? ?Lab 03/17/2019: Serum glucose 88 mg, BUN 13, creatinine 0.7, EGFR >60 mL.  Sodium 142, potassium 4.4, CMP otherwise normal. ? ?Hb 13.8/HCT 44.5, platelets 175, normal indicis. ? ?Total cholesterol 170, triglycerides 40, HDL 58, LDL 104.  Non-HDL cholesterol 112. ? ?TSH 1.77, normal. ? ?Medications and allergies  ?No Known Allergies  ? ?Current Outpatient Medications  ?Medication Instructions  ? CALCIUM-MAG-VIT C-VIT D PO Oral, Every other day  ? Multiple Vitamin (MULTIVITAMIN ADULT PO) 1 tablet, Oral, Daily  ? norethindrone (AYGESTIN) 5 mg, Oral, See admin instructions, Take 5 mg by mouth once daily for 15 days each month  ? Premarin 0.45 mg, Oral, Daily  ? VITAMIN D PO 1 tablet, Oral, Daily  ? ?Radiology:  ? ?Chest x-ray PA and lateral view 03/11/2019: ?New pacing leads are in the right atrium and right ventricle. ?Negative for pneumothorax or acute disease. ? ?Cardiac Studies:  ? ?Echocardiogram 03/11/2019:  ? 1. Left ventricular ejection fraction, by estimation, is 55 to 60%. The  left ventricle has normal function. The left ventricle has no regional  ?wall motion abnormalities. Left ventricular diastolic parameters were normal.  ? 2. Right ventricular systolic function is normal. The right ventricular  size is normal. There is normal pulmonary artery systolic pressure.  ? 3. The m itral valve is normal in structure and function. Trivial mitral  valve regurgitation. Mild mitral annular calcification.  ? 4. The inferior vena cava is normal in size with <50% respiratory variability, suggesting right atrial pressure of 8 mmHg. ? ?Device Clinic: Pacemaker Dual chamber Medtronic Azure XT MRI conditional dual-chamber pacemaker with left bundle pacing 03/11/2019 ? ?Remote dual-chamber pacemaker transmission 04/17/2021: ?AP 14%, VP 98.5%.  Lead impedance and thresholds within normal limits.  Longevity 8 years and 11 months.  There were brief atrial tachycardia episodes.  No atrial fibrillation.  Normal pacemaker  function.  Excellent physical activity. ? ?Scheduled  In office pacemaker check 05/11/21  ?Single (S)/Dual (D)/BV: D. ?Presenting ASVP. ?Pacemaker dependant:  Yes. Underlying No escape. AP 11%, VP 100%.   ?AMS Episodes Brief.  AT/AF burden <0.1% .   ?HVR 0 Since Oct 2022 Brief NSVT. ?Longevity 9 Years. Magnet rate: >85%. ?Lead measurements: Stable. ?Histogram: Low (L)/normal (N)/high (H)  Normal. Patient activity Good.  ? ?Observations: Normal pacemaker function. Changes: None.  ? ?EKG: ? ?EKG 05/11/2020: Sinus rhythm with first-degree AV block at rate of 70 bpm, ventricularly paced rhythm.  No further analysis.  His bundle pacing detected.   No significant change from EKG 11/11/2019  ? ?Assessment  ? ?  ICD-10-CM   ?1. Pacemaker Dual chamber Medtronic Azure XT MRI conditional dual-chamber pacemaker with left bundle  pacing 03/11/2019  Z95.0   ?  ?2. Complete heart block ([facility])  I44.2   ?  ?3. Primary hypertension  I10   ?  ?4. White coat syndrome with high blood pressure but without hypertension  R03.0   ?  ?  ?No orders of the defined types were placed in this encounter. ?  ?There are no discontinued medications.  ? ?Recommendations:  ? ?Lindsay Frazier  is a 88 y.o. Caucasian female patient with no significant medical history, not on any medications except  Estrogen replacement,  admitted on 03/10/2019 with complete heart block, underwent successful pacemaker implantation with Medtronic dual-chamber pacemaker with His bundle pacing.  ? ?She is presently doing well, except occasional episodes of palpitations.  Physical examination is unremarkable.  Blood pressure is elevated today, however on review of records from PCP,  blood pressure was normal. BP recordings from home are normal. She will continue to monitor. ? ?Reviewed her pacemaker transmission. Pacemaker is functioning normally. Will see her back in 1 year with pacer check.  I reviewed her labs from PCP. Normal renal function and CBC normal.  ? ?Her blood  pressure is again markedly elevated.  She is very hesitant to take any medications as multiple home recordings have been completely normal.  In spite of 88 years of age, her physical examination remains unremark

## 2022-05-11 ENCOUNTER — Ambulatory Visit: Payer: Medicare Other | Admitting: Cardiology

## 2022-05-11 ENCOUNTER — Encounter: Payer: Self-pay | Admitting: Cardiology

## 2022-05-11 VITALS — BP 136/80 | HR 67 | Resp 16 | Ht 65.0 in | Wt 132.0 lb

## 2022-05-11 DIAGNOSIS — I10 Essential (primary) hypertension: Secondary | ICD-10-CM

## 2022-05-11 DIAGNOSIS — K219 Gastro-esophageal reflux disease without esophagitis: Secondary | ICD-10-CM

## 2022-05-11 DIAGNOSIS — I442 Atrioventricular block, complete: Secondary | ICD-10-CM

## 2022-05-11 DIAGNOSIS — Z950 Presence of cardiac pacemaker: Secondary | ICD-10-CM

## 2022-05-11 MED ORDER — PANTOPRAZOLE SODIUM 40 MG PO TBEC: 40.0000 mg | DELAYED_RELEASE_TABLET | Freq: Every day | ORAL | 0 refills | Status: DC

## 2022-05-11 NOTE — Progress Notes (Signed)
Primary Physician/Referring:  Chilton Greathouse, MD  Patient ID: Lindsay Frazier, female    DOB: [birth year], 89 y.o.   MRN: [id number]  Chief Complaint  Patient presents with   Hypertension   Follow-up    1 year    HPI:    Lindsay Frazier  is a 89 y.o. Caucasian female patient with no significant medical history, not on any medications except  Estrogen replacement, Started noticing marked fatigue, shortness of breath and dizziness, admitted on 03/10/2019 with complete heart block, underwent successful pacemaker implantation with Medtronic dual-chamber pacemaker with His bundle pacing.    She prefers holistic type medicines.  She c/o heart burn, constant cough and in fact states that she had blood-tinged sputum due to dry cough this morning.  She was distressed about this.  Otherwise remained stable.  Past Medical History:  Diagnosis Date   Arthritis    Complete heart block ([facility]) 03/10/2019   Encounter for care of pacemaker 03/11/2019   GERD (gastroesophageal reflux disease)    Irregular heart beat    Pacemaker Dual chamber Medtronic Azure XT MRI conditional dual-chamber pacemaker with left bundle pacing 03/11/2019 03/11/2019   Yeast infection of the vagina    current on 11/14/2015 per patient    Past Surgical History:  Procedure Laterality Date   PACEMAKER IMPLANT N/A 03/11/2019   Procedure: PACEMAKER IMPLANT;  Surgeon: Hillis Range, MD;  Location: [facility];  Service: Cardiovascular;  Laterality: N/A;   TOTAL KNEE ARTHROPLASTY Left 11/21/2015   Procedure: LEFT TOTAL KNEE ARTHROPLASTY;  Surgeon: Ollen Gross, MD;  Location: [facility];  Service: Orthopedics;  Laterality: Left;   Family History  Problem Relation Age of Onset   Arthritis Mother    Osteoporosis Mother    Stroke Father    Alzheimer's disease Sister     Social History   Tobacco Use   Smoking status: Former    Packs/day: 0.25    Years: 5.00    Additional pack years: 0.00    Total pack years: 1.25     Types: Cigarettes   Smokeless tobacco: Never   Tobacco comments:    Briefly in college.  Substance Use Topics   Alcohol use: Yes    Alcohol/week: 4.0 standard drinks of alcohol    Types: 4 Glasses of wine per week    Comment: weekly   ROS  Review of Systems  Constitutional: Negative for malaise/fatigue.  Cardiovascular:  Negative for chest pain, dyspnea on exertion, leg swelling and palpitations.  Gastrointestinal:  Positive for heartburn. Negative for melena.   Objective  Blood pressure 136/80, pulse 67, resp. rate 16, height 5\' 5"  (1.651 m), weight 132 lb (59.9 kg), SpO2 94 %.     05/11/2022    2:25 PM 05/11/2022    2:24 PM 05/11/2021    1:41 PM  Vitals with BMI  Height  5\' 5"    Weight  132 lbs   BMI  21.97   Systolic 136 164 409  Diastolic 80 81 80  Pulse 67 66 67     Physical Exam Neck:     Vascular: No carotid bruit or JVD.  Cardiovascular:     Rate and Rhythm: Normal rate and regular rhythm.     Pulses: Intact distal pulses.     Heart sounds: Normal heart sounds. No murmur heard.    No gallop.  Pulmonary:     Effort: Pulmonary effort is normal.     Breath sounds: Normal breath sounds.  Abdominal:  General: Bowel sounds are normal.     Palpations: Abdomen is soft.  Musculoskeletal:     Right lower leg: No edema.     Left lower leg: No edema.    Laboratory examination:    External labs:   Cholesterol, total 201.000 m 05/02/2021 HDL 52.000 mg 05/02/2021 LDL 136.000 m 05/02/2021 Triglycerides 65.000 mg 05/02/2021  A1C 5.700 % 03/11/2019  Hemoglobin 13.800 g/d 03/19/2019  Creatinine, Serum 0.700 mg/ 03/19/2019 Potassium 4.100 mEq 04/14/2020 ALT (SGPT) 20.000 IU/ 04/14/2020  TSH 2.670 05/02/2021  Radiology:   Chest x-ray PA and lateral view 03/11/2019: New pacing leads are in the right atrium and right ventricle. Negative for pneumothorax or acute disease.  Cardiac Studies:   Echocardiogram 03/11/2019:   1. Left ventricular ejection fraction, by  estimation, is 55 to 60%. The  left ventricle has normal function. The left ventricle has no regional  wall motion abnormalities. Left ventricular diastolic parameters were normal.   2. Right ventricular systolic function is normal. The right ventricular  size is normal. There is normal pulmonary artery systolic pressure.   3. The m itral valve is normal in structure and function. Trivial mitral  valve regurgitation. Mild mitral annular calcification.   4. The inferior vena cava is normal in size with <50% respiratory variability, suggesting right atrial pressure of 8 mmHg.  Device Clinic: Pacemaker Dual chamber Medtronic Azure XT MRI conditional dual-chamber pacemaker with left bundle pacing 03/11/2019  Remote dual-chamber pacemaker transmission 05/04/2022: Longevity 8 years and 0 months.  AP 13%, VP 98%.  There was 1 brief NSVT episode and 1 brief atrial tachycardia episode.  Lead impedance and thresholds are within normal limits.  Normal pacemaker function.  EKG:  EKG 05/11/2022: AV paced rhythm at a rate of 60 bpm.  Left bundle pacing suspected.  No further analysis.  Compared to 05/11/2020, previously a sensed, V paced rhythm.   Medications and allergies  No Known Allergies  Current Outpatient Medications:    CALCIUM-MAG-VIT C-VIT D PO, Take by mouth every other day., Disp: , Rfl:    Multiple Vitamin (MULTIVITAMIN ADULT PO), Take 1 tablet by mouth daily., Disp: , Rfl:    norethindrone (AYGESTIN) 5 MG tablet, Take 5 mg by mouth See admin instructions. Take 5 mg by mouth once daily for 15 days each month, Disp: , Rfl:    pantoprazole (PROTONIX) 40 MG tablet, Take 1 tablet (40 mg total) by mouth daily before breakfast., Disp: 30 tablet, Rfl: 0   PREMARIN 0.45 MG tablet, Take 0.45 mg by mouth daily., Disp: , Rfl:    VITAMIN D PO, Take 1 tablet by mouth daily., Disp: , Rfl:    Assessment     ICD-10-CM   1. Complete heart block (HCC)  I44.2 EKG 12-Lead    2. Pacemaker Dual chamber  Medtronic Azure XT MRI conditional dual-chamber pacemaker with left bundle pacing 03/11/2019  Z95.0     3. Primary hypertension  I10     4. Gastroesophageal reflux disease without esophagitis  K21.9 pantoprazole (PROTONIX) 40 MG tablet      Meds ordered this encounter  Medications   pantoprazole (PROTONIX) 40 MG tablet    Sig: Take 1 tablet (40 mg total) by mouth daily before breakfast.    Dispense:  30 tablet    Refill:  0    There are no discontinued medications.   Recommendations:   Lindsay Frazier  is a 89 y.o. Caucasian female patient with no significant medical history, not on  any medications except  Estrogen replacement,  admitted on 03/10/2019 with complete heart block, underwent successful pacemaker implantation with Medtronic dual-chamber pacemaker with His bundle pacing.   1. Pacemaker Dual chamber Medtronic Azure XT MRI conditional dual-chamber pacemaker with left bundle pacing 03/11/2019 The pacemaker data from remote transmission, pacemaker is functioning normally.  2. Complete heart block (HCC) Patient has complete heart block and is pacemaker dependent. - EKG 12-Lead  3. Primary hypertension Patient has whitecoat hypertension.  When she initially presented to our office, blood pressure was markedly elevated.  After she sat down for a while and I rechecked it, blood pressure is completely normal, home recordings have been under excellent control.  Hence I did not make any changes to her medications.  4. Gastroesophageal reflux disease without esophagitis Patient has had significant GERD, cough, presently not on any PPI.  Will Rx pantoprazole.  Symptoms appear to be major and predominating symptom today, she has an appointment to see Dr. Felipa Eth soon, she will discuss further with him.  Although 89 years of age, continues to be independent and leads an active life.  - pantoprazole (PROTONIX) 40 MG tablet; Take 1 tablet (40 mg total) by mouth daily before breakfast.   Dispense: 30 tablet; Refill: 0  Reviewed external labs.  She has complete physical coming up soon next month.  She will be getting more recent labs.  I will see her back in the office in 1 year.  I will set her up for pacemaker check.   [name], MD, [facility] 05/11/2022, 5:27 PM Office: [phone] Pager: [phone]

## 2022-06-02 ENCOUNTER — Other Ambulatory Visit: Payer: Self-pay | Admitting: Cardiology

## 2022-06-02 DIAGNOSIS — K219 Gastro-esophageal reflux disease without esophagitis: Secondary | ICD-10-CM

## 2022-12-18 ENCOUNTER — Ambulatory Visit (INDEPENDENT_AMBULATORY_CARE_PROVIDER_SITE_OTHER): Payer: Medicare Other

## 2022-12-18 DIAGNOSIS — I442 Atrioventricular block, complete: Secondary | ICD-10-CM

## 2022-12-19 LAB — CUP PACEART REMOTE DEVICE CHECK
Battery Remaining Longevity: 90 mo
Battery Voltage: 2.99 V
Brady Statistic AP VP Percent: 8.23 %
Brady Statistic AP VS Percent: 0.12 %
Brady Statistic AS VP Percent: 85.17 %
Brady Statistic AS VS Percent: 6.48 %
Brady Statistic RA Percent Paced: 8.9 %
Brady Statistic RV Percent Paced: 93.4 %
Date Time Interrogation Session: 20241203053135
Implantable Lead Connection Status: 753985
Implantable Lead Connection Status: 753985
Implantable Lead Implant Date: 20210224
Implantable Lead Implant Date: 20210224
Implantable Lead Location: 753859
Implantable Lead Location: 753860
Implantable Lead Model: 3830
Implantable Lead Model: 5076
Implantable Pulse Generator Implant Date: 20210224
Lead Channel Impedance Value: 304 Ohm
Lead Channel Impedance Value: 323 Ohm
Lead Channel Impedance Value: 342 Ohm
Lead Channel Impedance Value: 437 Ohm
Lead Channel Pacing Threshold Amplitude: 0.625 V
Lead Channel Pacing Threshold Amplitude: 0.875 V
Lead Channel Pacing Threshold Pulse Width: 0.4 ms
Lead Channel Pacing Threshold Pulse Width: 0.4 ms
Lead Channel Sensing Intrinsic Amplitude: 1.125 mV
Lead Channel Sensing Intrinsic Amplitude: 1.125 mV
Lead Channel Sensing Intrinsic Amplitude: 19 mV
Lead Channel Sensing Intrinsic Amplitude: 19 mV
Lead Channel Setting Pacing Amplitude: 1.5 V
Lead Channel Setting Pacing Amplitude: 2.5 V
Lead Channel Setting Pacing Pulse Width: 0.4 ms
Lead Channel Setting Sensing Sensitivity: 1.2 mV
Zone Setting Status: 755011
Zone Setting Status: 755011

## 2023-03-19 ENCOUNTER — Ambulatory Visit (INDEPENDENT_AMBULATORY_CARE_PROVIDER_SITE_OTHER): Payer: Medicare Other

## 2023-03-19 DIAGNOSIS — I442 Atrioventricular block, complete: Secondary | ICD-10-CM | POA: Diagnosis not present

## 2023-03-21 LAB — CUP PACEART REMOTE DEVICE CHECK
Battery Remaining Longevity: 88 mo
Battery Voltage: 2.99 V
Brady Statistic AP VP Percent: 2.7 %
Brady Statistic AP VS Percent: 0.14 %
Brady Statistic AS VP Percent: 78.24 %
Brady Statistic AS VS Percent: 18.92 %
Brady Statistic RA Percent Paced: 3.45 %
Brady Statistic RV Percent Paced: 80.94 %
Date Time Interrogation Session: 20250303220615
Implantable Lead Connection Status: 753985
Implantable Lead Connection Status: 753985
Implantable Lead Implant Date: 20210224
Implantable Lead Implant Date: 20210224
Implantable Lead Location: 753859
Implantable Lead Location: 753860
Implantable Lead Model: 3830
Implantable Lead Model: 5076
Implantable Pulse Generator Implant Date: 20210224
Lead Channel Impedance Value: 285 Ohm
Lead Channel Impedance Value: 304 Ohm
Lead Channel Impedance Value: 342 Ohm
Lead Channel Impedance Value: 418 Ohm
Lead Channel Pacing Threshold Amplitude: 0.5 V
Lead Channel Pacing Threshold Amplitude: 1 V
Lead Channel Pacing Threshold Pulse Width: 0.4 ms
Lead Channel Pacing Threshold Pulse Width: 0.4 ms
Lead Channel Sensing Intrinsic Amplitude: 1.125 mV
Lead Channel Sensing Intrinsic Amplitude: 1.125 mV
Lead Channel Sensing Intrinsic Amplitude: 15.875 mV
Lead Channel Sensing Intrinsic Amplitude: 15.875 mV
Lead Channel Setting Pacing Amplitude: 1.5 V
Lead Channel Setting Pacing Amplitude: 2.5 V
Lead Channel Setting Pacing Pulse Width: 0.4 ms
Lead Channel Setting Sensing Sensitivity: 1.2 mV
Zone Setting Status: 755011
Zone Setting Status: 755011

## 2023-04-23 NOTE — Progress Notes (Signed)
 Remote pacemaker transmission.

## 2023-04-23 NOTE — Addendum Note (Signed)
 Addended by: Geralyn Flash D on: 04/23/2023 09:26 AM   Modules accepted: Orders

## 2023-05-09 ENCOUNTER — Ambulatory Visit: Payer: Medicare Other | Admitting: Cardiology

## 2023-05-31 ENCOUNTER — Encounter: Payer: Self-pay | Admitting: Cardiology

## 2023-05-31 ENCOUNTER — Ambulatory Visit: Attending: Cardiology | Admitting: Cardiology

## 2023-05-31 VITALS — BP 178/88 | HR 61 | Ht 65.0 in | Wt 136.6 lb

## 2023-05-31 DIAGNOSIS — I10 Essential (primary) hypertension: Secondary | ICD-10-CM

## 2023-05-31 DIAGNOSIS — Z950 Presence of cardiac pacemaker: Secondary | ICD-10-CM | POA: Diagnosis not present

## 2023-05-31 DIAGNOSIS — I442 Atrioventricular block, complete: Secondary | ICD-10-CM | POA: Diagnosis not present

## 2023-05-31 NOTE — Patient Instructions (Signed)
 Medication Instructions:  Your physician recommends that you continue on your current medications as directed. Please refer to the Current Medication list given to you today.  *If you need a refill on your cardiac medications before your next appointment, please call your pharmacy*  Follow-Up: At [facility], you and your health needs are our priority.  As part of our continuing mission to provide you with exceptional heart care, our providers are all part of one team.  This team includes your primary Cardiologist (physician) and Advanced Practice Providers or APPs (Physician Assistants and Nurse Practitioners) who all work together to provide you with the care you need, when you need it.  Your next appointment:   1 year(s)  The format for your next appointment:   In Person  Provider:   Knox Perl, MD{  We recommend signing up for the patient portal called "MyChart".  Sign up information is provided on this After Visit Summary.  MyChart is used to connect with patients for Virtual Visits (Telemedicine).  Patients are able to view lab/test results, encounter notes, upcoming appointments, etc.  Non-urgent messages can be sent to your provider as well.   To learn more about what you can do with MyChart, go to [email or web address].

## 2023-05-31 NOTE — Progress Notes (Signed)
 Cardiology Office Note:  .   Date:  05/31/2023  ID:  Lindsay Frazier, DOB 10-16-1933, MRN [id number] PCP: Avva, Ravisankar, MD  [facility] Providers Cardiologist:  Knox Perl, MD   History of Present Illness: .   Lindsay Frazier is a 89 y.o.   Discussed the use of AI scribe software for clinical note transcription with the patient, who gave verbal consent to proceed.  History of Present Illness Lindsay Frazier is an 89 year old female with a pacemaker who presents for cardiovascular follow-up of complete heart block, elevated blood pressure.  She was recently diagnosed with polymyalgia rheumatica.  She was started on high-dose prednisone, since then she feels 'loopy' and slightly off balance, especially when walking or shopping, but has not experienced any falls. She is unsure if this is related to her heart condition or pacemaker.  However she states that she feels well and finally is not having any myalgias send arthralgias and marked fatigue.  States that she feels that she has more energy.  She has not been regularly monitoring her blood pressure at home. She recalls a recent low reading at a previous appointment and experienced a high reading today, possibly due to stress.  She missed a pacemaker monitoring appointment due to being distracted by other health issues. She has not experienced any irregular heartbeats.  Labs   External Labs:  Care Everywhere labs 03/26/2023:  Hb 11.5/HCT 36.0, platelets 475, normal indicis.  Serum glucose 103 mg, BUN 11, creatinine 0.73, EGFR 79 mL.  LFTs normal.  ROS  Review of Systems  Cardiovascular:  Negative for chest pain, dyspnea on exertion and leg swelling.  Musculoskeletal:  Positive for arthritis.  Gastrointestinal:  Positive for heartburn (with prednisone).    Physical Exam:   VS:  BP (!) 178/88 (BP Location: Right Arm, Patient Position: Sitting, Cuff Size: Normal) Comment: manuel reading  Pulse 61   Ht 5\' 5"  (1.651 m)    Wt 136 lb 9.6 oz (62 kg)   SpO2 99%   BMI 22.73 kg/m    Wt Readings from Last 3 Encounters:  05/31/23 136 lb 9.6 oz (62 kg)  05/11/22 132 lb (59.9 kg)  05/11/21 129 lb 3.2 oz (58.6 kg)    Physical Exam Neck:     Vascular: No carotid bruit or JVD.  Cardiovascular:     Rate and Rhythm: Normal rate and regular rhythm.     Pulses: Intact distal pulses.     Heart sounds: Normal heart sounds. No murmur heard.    No gallop.  Pulmonary:     Effort: Pulmonary effort is normal.     Breath sounds: Normal breath sounds.  Abdominal:     General: Bowel sounds are normal.     Palpations: Abdomen is soft.  Musculoskeletal:     Right lower leg: No edema.     Left lower leg: No edema.    Studies Reviewed: Aaron Aas    Dual-chamber pacemaker transmission 03/18/2023: Normal pacemaker function, AP 1 to 2%, VP 100%.  Battery life 6 to 7 years.  No high rate episodes. EKG:    EKG Interpretation Date/Time:  Friday May 31 2023 10:33:09 EDT Ventricular Rate:  61 PR Interval:  176 QRS Duration:  108 QT Interval:  396 QTC Calculation: 398 R Axis:   3  Text Interpretation: EKG 05/31/2023: AV paced rhythm, left bundle pacing suspected.  Heart rate 61 bpm.  No further analysis.  Compared to 03/11/2019, a PAC new, no  change in LV pacing. Confirmed by Asheley Hellberg, Jagadeesh ([id number]) on 05/31/2023 11:18:08 AM    Medications and allergies    No Known Allergies   Current Outpatient Medications:    CALCIUM-MAG-VIT C-VIT D PO, Take by mouth every other day., Disp: , Rfl:    Multiple Vitamin (MULTIVITAMIN ADULT PO), Take 1 tablet by mouth daily., Disp: , Rfl:    norethindrone (AYGESTIN) 5 MG tablet, Take 5 mg by mouth See admin instructions. Take 5 mg by mouth once daily for 15 days each month, Disp: , Rfl:    pantoprazole  (PROTONIX ) 40 MG tablet, TAKE 1 TABLET BY MOUTH DAILY BEFORE BREAKFAST, Disp: 90 tablet, Rfl: 3   PREMARIN 0.45 MG tablet, Take 0.45 mg by mouth daily., Disp: , Rfl:    VITAMIN D PO, Take 1 tablet by  mouth daily., Disp: , Rfl:    No orders of the defined types were placed in this encounter.    There are no discontinued medications.   ASSESSMENT AND PLAN: .      ICD-10-CM   1. Primary hypertension  I10 EKG 12-Lead    2. Complete heart block (HCC)  I44.2     3. Pacemaker Dual chamber Med[employer]onic Azure XT MRI conditional dual-chamber pacemaker with left bundle pacing 03/11/2019  Z95.0     4. White coat syndrome with diagnosis of hypertension  I10       Assessment and Plan Assessment & Plan Polymyalgia rheumatica   Her symptoms have significantly improved with prednisone therapy, resolving severe shoulder, arm, hand, and back pain, which enhances mobility and daily functioning. She is on a tapering dose of prednisone and experiences mild side effects like dizziness and potential balance issues. Prednisone may also contribute to hypertension. Continue prednisone taper as directed. Monitor for side effects, including balance issues and hypertension. Encourage a gradual increase in physical activity as symptoms allow. Monitor blood pressure at home and report significant changes.  Gastroesophageal reflux disease   Prednisone exacerbates GERD symptoms, leading to increased coughing and worsening reflux. Monitor GERD symptoms and consider management adjustments if symptoms persist.  Pacemaker in situ   The pacemaker is functioning well, with recent transmission showing stable lead measurements and adequate battery life of 6-7 years remaining. Upper chamber pacing is at 1-2%, and lower chamber pacing is at 100%. She missed a recent pacemaker monitoring appointment but is reassured by the current pacemaker status. Arrange a pacemaker check appointment and reassure her regarding pacemaker function and battery life.  Will schedule her for an in person pacemaker check.  Elevated blood pressure with diagnosis of hypertension Although patient denies history of hypertension, her blood pressures  always been elevated, will continue to monitor this for now as she was recently started on a high dose of prednisone which is being tapered off.  She states that she will also continue to monitor and does not want therapy for now.  She has an appointment to see her PCP Dr. Rolene Client soon and also has labs scheduled.  I did review her external labs, hemoglobin has remained stable, renal function is normal.  I will see her back in a year or sooner if problems.   Signed,  Knox Perl, MD, [facility] 05/31/2023, 9:00 PM [facility] [address] [place], [place] [address] Phone: [phone]. Fax:  [phone]

## 2023-06-18 ENCOUNTER — Ambulatory Visit (INDEPENDENT_AMBULATORY_CARE_PROVIDER_SITE_OTHER): Payer: Medicare Other

## 2023-06-18 DIAGNOSIS — I442 Atrioventricular block, complete: Secondary | ICD-10-CM | POA: Diagnosis not present

## 2023-06-18 LAB — CUP PACEART REMOTE DEVICE CHECK
Battery Remaining Longevity: 86 mo
Battery Voltage: 2.98 V
Brady Statistic AP VP Percent: 10.07 %
Brady Statistic AP VS Percent: 0.53 %
Brady Statistic AS VP Percent: 51.35 %
Brady Statistic AS VS Percent: 38.06 %
Brady Statistic RA Percent Paced: 11.02 %
Brady Statistic RV Percent Paced: 61.41 %
Date Time Interrogation Session: 20250603072400
Implantable Lead Connection Status: 753985
Implantable Lead Connection Status: 753985
Implantable Lead Implant Date: 20210224
Implantable Lead Implant Date: 20210224
Implantable Lead Location: 753859
Implantable Lead Location: 753860
Implantable Lead Model: 3830
Implantable Lead Model: 5076
Implantable Pulse Generator Implant Date: 20210224
Lead Channel Impedance Value: 304 Ohm
Lead Channel Impedance Value: 323 Ohm
Lead Channel Impedance Value: 342 Ohm
Lead Channel Impedance Value: 418 Ohm
Lead Channel Pacing Threshold Amplitude: 0.5 V
Lead Channel Pacing Threshold Amplitude: 1 V
Lead Channel Pacing Threshold Pulse Width: 0.4 ms
Lead Channel Pacing Threshold Pulse Width: 0.4 ms
Lead Channel Sensing Intrinsic Amplitude: 1.5 mV
Lead Channel Sensing Intrinsic Amplitude: 1.5 mV
Lead Channel Sensing Intrinsic Amplitude: 23.125 mV
Lead Channel Sensing Intrinsic Amplitude: 23.125 mV
Lead Channel Setting Pacing Amplitude: 1.5 V
Lead Channel Setting Pacing Amplitude: 2.5 V
Lead Channel Setting Pacing Pulse Width: 0.4 ms
Lead Channel Setting Sensing Sensitivity: 1.2 mV
Zone Setting Status: 755011
Zone Setting Status: 755011

## 2023-06-20 ENCOUNTER — Ambulatory Visit: Payer: Self-pay | Admitting: Cardiovascular Disease

## 2023-08-13 NOTE — Progress Notes (Signed)
 Remote pacemaker transmission.

## 2023-09-17 ENCOUNTER — Ambulatory Visit (INDEPENDENT_AMBULATORY_CARE_PROVIDER_SITE_OTHER): Payer: Medicare Other

## 2023-09-17 DIAGNOSIS — I442 Atrioventricular block, complete: Secondary | ICD-10-CM | POA: Diagnosis not present

## 2023-09-19 ENCOUNTER — Ambulatory Visit: Payer: Self-pay | Admitting: Cardiovascular Disease

## 2023-09-19 LAB — CUP PACEART REMOTE DEVICE CHECK
Battery Remaining Longevity: 85 mo
Battery Voltage: 2.98 V
Brady Statistic AP VP Percent: 8.66 %
Brady Statistic AP VS Percent: 0.5 %
Brady Statistic AS VP Percent: 52.2 %
Brady Statistic AS VS Percent: 38.65 %
Brady Statistic RA Percent Paced: 9.89 %
Brady Statistic RV Percent Paced: 60.86 %
Date Time Interrogation Session: 20250901215851
Implantable Lead Connection Status: 753985
Implantable Lead Connection Status: 753985
Implantable Lead Implant Date: 20210224
Implantable Lead Implant Date: 20210224
Implantable Lead Location: 753859
Implantable Lead Location: 753860
Implantable Lead Model: 3830
Implantable Lead Model: 5076
Implantable Pulse Generator Implant Date: 20210224
Lead Channel Impedance Value: 304 Ohm
Lead Channel Impedance Value: 304 Ohm
Lead Channel Impedance Value: 342 Ohm
Lead Channel Impedance Value: 418 Ohm
Lead Channel Pacing Threshold Amplitude: 0.625 V
Lead Channel Pacing Threshold Amplitude: 1 V
Lead Channel Pacing Threshold Pulse Width: 0.4 ms
Lead Channel Pacing Threshold Pulse Width: 0.4 ms
Lead Channel Sensing Intrinsic Amplitude: 1.625 mV
Lead Channel Sensing Intrinsic Amplitude: 1.625 mV
Lead Channel Sensing Intrinsic Amplitude: 18.875 mV
Lead Channel Sensing Intrinsic Amplitude: 18.875 mV
Lead Channel Setting Pacing Amplitude: 1.5 V
Lead Channel Setting Pacing Amplitude: 2.5 V
Lead Channel Setting Pacing Pulse Width: 0.4 ms
Lead Channel Setting Sensing Sensitivity: 1.2 mV
Zone Setting Status: 755011
Zone Setting Status: 755011

## 2023-09-24 NOTE — Progress Notes (Signed)
 Remote PPM Transmission

## 2023-12-17 ENCOUNTER — Ambulatory Visit: Payer: Medicare Other

## 2023-12-17 DIAGNOSIS — I442 Atrioventricular block, complete: Secondary | ICD-10-CM | POA: Diagnosis not present

## 2023-12-18 LAB — CUP PACEART REMOTE DEVICE CHECK
Battery Remaining Longevity: 83 mo
Battery Voltage: 2.98 V
Brady Statistic AP VP Percent: 8.14 %
Brady Statistic AP VS Percent: 0.41 %
Brady Statistic AS VP Percent: 62.84 %
Brady Statistic AS VS Percent: 28.61 %
Brady Statistic RA Percent Paced: 9.14 %
Brady Statistic RV Percent Paced: 70.98 %
Date Time Interrogation Session: 20251202032829
Implantable Lead Connection Status: 753985
Implantable Lead Connection Status: 753985
Implantable Lead Implant Date: 20210224
Implantable Lead Implant Date: 20210224
Implantable Lead Location: 753859
Implantable Lead Location: 753860
Implantable Lead Model: 3830
Implantable Lead Model: 5076
Implantable Pulse Generator Implant Date: 20210224
Lead Channel Impedance Value: 304 Ohm
Lead Channel Impedance Value: 323 Ohm
Lead Channel Impedance Value: 361 Ohm
Lead Channel Impedance Value: 437 Ohm
Lead Channel Pacing Threshold Amplitude: 0.5 V
Lead Channel Pacing Threshold Amplitude: 1.125 V
Lead Channel Pacing Threshold Pulse Width: 0.4 ms
Lead Channel Pacing Threshold Pulse Width: 0.4 ms
Lead Channel Sensing Intrinsic Amplitude: 1.375 mV
Lead Channel Sensing Intrinsic Amplitude: 1.375 mV
Lead Channel Sensing Intrinsic Amplitude: 21.75 mV
Lead Channel Sensing Intrinsic Amplitude: 21.75 mV
Lead Channel Setting Pacing Amplitude: 1.5 V
Lead Channel Setting Pacing Amplitude: 2.5 V
Lead Channel Setting Pacing Pulse Width: 0.4 ms
Lead Channel Setting Sensing Sensitivity: 1.2 mV
Zone Setting Status: 755011
Zone Setting Status: 755011

## 2023-12-19 ENCOUNTER — Ambulatory Visit: Payer: Self-pay | Admitting: Cardiovascular Disease

## 2023-12-20 NOTE — Progress Notes (Signed)
 Remote PPM Transmission
